# Patient Record
Sex: Male | Born: 1990
Health system: Southern US, Community
[De-identification: ages and names within clinical notes are randomized; demographics above are authoritative.]

## PROBLEM LIST (undated history)

## (undated) DIAGNOSIS — G9349 Other encephalopathy: Secondary | ICD-10-CM

## (undated) DIAGNOSIS — R569 Unspecified convulsions: Secondary | ICD-10-CM

## (undated) DIAGNOSIS — G40909 Epilepsy, unspecified, not intractable, without status epilepticus: Secondary | ICD-10-CM

## (undated) HISTORY — DX: Epilepsy, unspecified, not intractable, without status epilepticus: G40.909

## (undated) HISTORY — DX: Other encephalopathy: G93.49

## (undated) HISTORY — PX: OTHER SURGICAL HISTORY: SHX169

---
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
## 2010-04-21 ENCOUNTER — Inpatient Hospital Stay (HOSPITAL_COMMUNITY)
Admission: EM | Admit: 2010-04-21 | Discharge: 2010-04-23 | Payer: Self-pay | Source: Home / Self Care | Attending: Orthopaedic Surgery | Admitting: Orthopaedic Surgery

## 2010-04-21 LAB — COMPREHENSIVE METABOLIC PANEL
ALT: 43 U/L (ref 0–53)
AST: 32 U/L (ref 0–37)
CO2: 26 mEq/L (ref 19–32)
Chloride: 108 mEq/L (ref 96–112)
GFR calc Af Amer: >60 mL/min (ref >60)
GFR calc non Af Amer: >60 mL/min (ref >60)
Glucose, Bld: 104 mg/dL — ABNORMAL HIGH (ref 70–99)
Sodium: 140 mEq/L (ref 135–145)
Total Bilirubin: 0.6 mg/dL (ref 0.3–1.2)

## 2010-04-21 LAB — CBC
HCT: 39.1 % (ref 39.0–52.0)
Hemoglobin: 13.5 g/dL (ref 13.0–17.0)
MCH: 31.7 pg (ref 26.0–34.0)
MCHC: 34.5 g/dL (ref 30.0–36.0)
RBC: 4.26 MIL/uL (ref 4.22–5.81)

## 2010-04-21 LAB — TYPE AND SCREEN: ABO/RH(D): A POS

## 2010-04-21 LAB — DIFFERENTIAL
Basophils Absolute: 0 10*3/uL (ref 0.0–0.1)
Basophils Relative: 0 % (ref 0–1)
Lymphocytes Relative: 5 % — ABNORMAL LOW (ref 12–46)
Monocytes Absolute: 0.4 10*3/uL (ref 0.1–1.0)
Monocytes Relative: 4 % (ref 3–12)
Neutro Abs: 8.6 10*3/uL — ABNORMAL HIGH (ref 1.7–7.7)
Neutrophils Relative %: 91 % — ABNORMAL HIGH (ref 43–77)

## 2010-04-22 LAB — HEMOGLOBIN AND HEMATOCRIT, BLOOD: HCT: 35 % — ABNORMAL LOW (ref 39.0–52.0)

## 2010-04-23 LAB — HEMOGLOBIN AND HEMATOCRIT, BLOOD
HCT: 33.4 % — ABNORMAL LOW (ref 39.0–52.0)
Hemoglobin: 11.7 g/dL — ABNORMAL LOW (ref 13.0–17.0)

## 2010-04-24 NOTE — Op Note (Addendum)
  NAME:  Kent Peterson, Kent Peterson            ACCOUNT NO.:  [id number]  MEDICAL RECORD NO.:  [id number]          PATIENT TYPE:  INP  LOCATION:  5037                         FACILITY:  [facility]  PHYSICIAN:  Chanler Mendonca C. Ophelia Charter, M.D.    DATE OF BIRTH:  Jul 02, 1990  DATE OF PROCEDURE:  04/21/2010 DATE OF DISCHARGE:                              OPERATIVE REPORT   PREOPERATIVE DIAGNOSIS:  Left intertrochanteric fracture.  POSTOPERATIVE DIAGNOSIS:  Left intertrochanteric fracture.  PROCEDURE:  Left hip compression screw, Synthes 135 degrees side plate, 78-GN lag screw.  SURGEON:  Hamlin Devine C. Ophelia Charter, MD  ANESTHESIA:  GOT plus 12 mL Marcaine and local.  ESTIMATED BLOOD LOSS:  100 mL.  BRIEF HISTORY:  A 20 year old male with caring parents who was placed in in-house swing that he normally gets in each morning and swings for a while as he wakes up.  This has a chair and in between the seat and the waist harness, the middle chain snapped in 2 causing fall out of the chair, flying forward and landing on his left side with bruises to his left elbow and a left intertrochanteric fracture.  The patient has severe autism and CP.  Hand is an ambulator with assistance for balance.  PROCEDURE:  After standard induction of general anesthesia, preoperative Ancef prophylaxis, standard prepping and draping of the left hip, the patient had been placed on the fracture table, well leg holder to the opposite right leg traction, internal rotation.  Minimal traction required due to the alignment and internal rotation lateralizing the trochanter due to some tightness in his hamstrings.  The foot was lowered a little bit more until the femur was lined up parallel with the floor.  Standard prepping, shower curtain, Steri-Drape applied.  Time- out procedure completed.  Incision was made starting at the tip of the trochanter extending in line with the femur, the length of four-hole plate.  Tensor fascia was split in line with the  fibers.  Hemostasis obtained.  Vastus lateralis elevated from posterior to anterior. Initially, based on x-rays, although the patient was in external rotation, it looked like there was some coxa vara.  Guide was placed laterally on the femur and turned out, the 135 degrees guide was appropriate.  Pin was placed center, checked AP and lateral, drilled to 90, 85-mm lag screw placed and the four-hole 135 side plate attached of the femur which fit nicely and kept the fracture in excellent position without displacement, tightened down with 4 screws, 34 mm in length.  Wound was irrigated.  Vastus lateralis reattached posteriorly.  Tensor fascia closed with #1 Vicryl, 2-0 Vicryl in subcutaneous tissue and a 3-0 Vicryl subcuticular skin closure.  Tincture of Benzoin, Steri-Strips and dressing was applied. Instrument count and needle count was correct.     Oval Cavazos C. Ophelia Charter, M.D.     MCY/MEDQ  D:  04/21/2010  T:  04/22/2010  Job:  [id number]  Electronically Signed by Annell Greening M.D. on 04/24/2010 01:04:28 PM

## 2010-06-19 NOTE — Discharge Summary (Signed)
NAME:  Kent Peterson, Kent Peterson            ACCOUNT NO.:  [id number]  MEDICAL RECORD NO.:  [id number]          PATIENT TYPE:  INP  LOCATION:  5037                         FACILITY:  [facility]  PHYSICIAN:  Zamoria Boss C. Ophelia Charter, M.D.    DATE OF BIRTH:  15-Nov-1990  DATE OF ADMISSION:  04/21/2010 DATE OF DISCHARGE:  04/23/2010                              DISCHARGE SUMMARY   ADMISSION DIAGNOSES: 1. Left intertrochanteric hip fracture. 2. Autism. 3. Cerebral palsy.  DISCHARGE DIAGNOSES: 1. Left intertrochanteric hip fracture. 2. Autism. 3. Cerebral palsy.  PROCEDURE:  On April 21, 2010, the patient underwent left hip compression screw with sideplate performed by Dr. Ophelia Charter under general anesthesia.  CONSULTATIONS:  None.  BRIEF HISTORY:  The patient is a 20 year old male with severe autism and cerebral palsy.  He requires assistance for ambulation as well as activities of daily living.  He is a Consulting civil engineer at [employer].  On the morning of admission, he was placed in a chair with a waist harness.  The chain snapped causing the patient to fall out of the chair landing on his left side.  He received bruises to his left elbow.  He was brought to the emergency room where x-rays revealed a left intertrochanteric hip fracture.  It was felt he would require surgical intervention and was admitted for the procedure as stated above.  BRIEF HOSPITAL COURSE:  The patient tolerated the procedure under general anesthesia without complications.  Postoperatively, the patient was given mild analgesics for discomfort and seemed to be comfortable. His left elbow was noted to have a contusion and ecchymosis.  X-ray findings were negative for fracture.  Dressing was placed over the abrasion.  No DVT prophylaxis was utilized chemically, instead SCDs were utilized with early motion in and out of bed.  The patient was seen by the physical therapist and did require maximum assistance for bed to chair transfer.  He did not  actually ambulate during the hospital stay, but was able to pivot for bed to chair transfers.  Dressing change was done, and his wound was healing without signs of infection.  Laboratory values showed hemoglobin dropping from 13.5-11.7 and hematocrit 39.1-33.4.  Otherwise, lab values remained within normal limits.  PLAN:  The patient was discharged to his home with his family.  He was instructed to continue home medications as taken prior to admission and was given prescription for Vicodin 1-2 every 4-6 hours as needed for pain.  Dressing change will be done daily or as needed.  He will keep his wound dry and clean.  The patient is instructed to follow up with Dr. Ophelia Charter in 1 week.  He will continue to receive physical therapy through his [employer]. Weightbearing as tolerated on the left lower extremity for ambulation and for pivoting.  The patient's family was advised to call Dr. Barbaraann Faster office should there be questions or concerns prior to his return office visit.  CONDITION ON DISCHARGE:  Stable.   Wende Neighbors, P.A.   ______________________________ Veverly Fells Ophelia Charter, M.D.    SMV/MEDQ  D:  05/29/2010  T:  05/30/2010  Job:  [id number]  Electronically Signed by Velna Hatchet  VERNON P.A. on 05/31/2010 09:18:42 AM Electronically Signed by Annell Greening M.D. on 06/19/2010 05:58:15 PM

## 2011-01-01 ENCOUNTER — Ambulatory Visit (HOSPITAL_COMMUNITY)
Admission: RE | Admit: 2011-01-01 | Discharge: 2011-01-01 | Disposition: A | Discharge status: Home / Self Care | Payer: 59 | Source: Ambulatory Visit | Attending: Oral Surgery | Admitting: Oral Surgery

## 2011-01-01 DIAGNOSIS — G809 Cerebral palsy, unspecified: Secondary | ICD-10-CM | POA: Insufficient documentation

## 2011-01-01 DIAGNOSIS — F79 Unspecified intellectual disabilities: Secondary | ICD-10-CM | POA: Insufficient documentation

## 2011-01-01 DIAGNOSIS — Z01812 Encounter for preprocedural laboratory examination: Secondary | ICD-10-CM | POA: Insufficient documentation

## 2011-01-01 DIAGNOSIS — H548 Legal blindness, as defined in USA: Secondary | ICD-10-CM | POA: Insufficient documentation

## 2011-01-01 DIAGNOSIS — K006 Disturbances in tooth eruption: Secondary | ICD-10-CM | POA: Insufficient documentation

## 2011-01-01 DIAGNOSIS — K089 Disorder of teeth and supporting structures, unspecified: Secondary | ICD-10-CM | POA: Insufficient documentation

## 2011-01-01 LAB — CBC
Hemoglobin: 14 g/dL (ref 13.0–17.0)
Platelets: 184 10*3/uL (ref 150–400)
RBC: 4.31 MIL/uL (ref 4.22–5.81)
WBC: 2.4 10*3/uL — ABNORMAL LOW (ref 4.0–10.5)

## 2011-01-01 LAB — BASIC METABOLIC PANEL
CO2: 29 mEq/L (ref 19–32)
Calcium: 9.4 mg/dL (ref 8.4–10.5)
Chloride: 102 mEq/L (ref 96–112)
Glucose, Bld: 92 mg/dL (ref 70–99)
Potassium: 3.6 mEq/L (ref 3.5–5.1)
Sodium: 139 mEq/L (ref 135–145)

## 2011-01-03 NOTE — Op Note (Signed)
NAMEBERTHA, Peterson            ACCOUNT NO.:  [id number]  MEDICAL RECORD NO.:  [id number]  LOCATION:  SDSC                         FACILITY:  [facility]  PHYSICIAN:  Georgia Lopes, M.D.  DATE OF BIRTH:  [birth year]  DATE OF PROCEDURE:  01/01/2011 DATE OF DISCHARGE:  01/01/2011                              OPERATIVE REPORT   PREOPERATIVE DIAGNOSIS:  Nonrestorable tooth #22.  POSTOPERATIVE DIAGNOSIS:  Nonrestorable tooth #M, impacted tooth #22.  PROCEDURE:  Removal of teeth numbers M and 22.  SURGEON:  Georgia Lopes, MD  ASSISTANTS: 1. Luberta Mutter, DOMA 2. Jamie Cimler, DOMA.  ANESTHESIA:  General, Dr. Sondra Come attending, oral intubation.  INDICATIONS FOR PROCEDURE:  Kent Peterson is a 20 year old male who is referred by his general dentist for removal of nonrestorable tooth #22.  The patient has significant mental retardation, is legally blind, has history of seizures.  He was unable to be examined in my office due to uncooperativeness.  He was scheduled for removal of reportedly nonrestorable tooth #22 and exam under anesthesia with treatment as necessary.  PROCEDURE:  The patient was taken to the operating room, and ketamine was administered intramuscularly by the anesthesiologist.  Once the patient was adequately anesthetized, he was moved to the operating table and an IV was started and general anesthesia was administered and an endotracheal tube was placed and marked.  The eyes were protected.  The patient was draped for the procedure.  The posterior pharynx was suctioned.  The Yankauer suction was placed and then a throat pack was placed.  The oral cavity was examined.  There appeared to be retained primary tooth #H with palatally erupted tooth #11.  It was determined to leave this as well as there was no existing periodontal disease in this area, and it was doubted that removal of retained tooth number H would in anyway help the eruption of tooth #11 into the correct  tooth position.  In the mandible there was some gingival edema around tooth #22.  There was no crown noted.  The remainder of the oral cavity was healthy and without obvious lesions or decay.  The decision was made to only remove tooth #22.  Lidocaine 2%, 1:100,000 epinephrine was infiltrated in the inferior alveolar block on the left side and infiltration around tooth #22.  A total of 5 mL was utilized.  A bite block was placed in the right side of the mouth and a sweetheart retractor was used to retract the tongue.  A #15 blade was used to make a full-thickness incision beginning at tooth #21 and carrying anteriorly to tooth #23.  The periosteum was reflected.  There was no obvious tooth #22 present.  There was some granulomatous material in this area and portions of root which appeared to be primary tooth.  These were curetted and then the socket was examined and there appeared to be a portion of the tooth impacted down below the bony crypt from the infection.  Bone was removed and impacted tooth #22 was identified.  The tooth was uncovered by removing bone with the handpiece under irrigation.  The tooth was sectioned in multiple pieces and removed and the socket was curetted, irrigated, and closed  with 3-0 chromic.  The patient tolerated the procedure well.  The oral cavity was irrigated, suctioned, and throat pack was removed.  The patient was taken to the recovery room, breathing spontaneously in good condition.  ESTIMATED BLOOD LOSS:  Minimum.  COMPLICATIONS:  None.  SPECIMENS:  None.     Georgia Lopes, M.D.     SMJ/MEDQ  D:  01/01/2011  T:  01/01/2011  Job:  [id number]  Electronically Signed by Ocie Doyne M.D. on 01/03/2011 03:17:36 PM

## 2015-01-21 ENCOUNTER — Emergency Department (INDEPENDENT_AMBULATORY_CARE_PROVIDER_SITE_OTHER)
Admission: EM | Admit: 2015-01-21 | Discharge: 2015-01-21 | Disposition: A | Discharge status: Home / Self Care | Payer: Medicaid Other | Source: Home / Self Care | Attending: Family Medicine | Admitting: Family Medicine

## 2015-01-21 DIAGNOSIS — L089 Local infection of the skin and subcutaneous tissue, unspecified: Secondary | ICD-10-CM

## 2015-01-21 DIAGNOSIS — L0889 Other specified local infections of the skin and subcutaneous tissue: Secondary | ICD-10-CM

## 2015-01-21 DIAGNOSIS — L02411 Cutaneous abscess of right axilla: Secondary | ICD-10-CM

## 2015-01-21 MED ORDER — CLINDAMYCIN HCL 300 MG PO CAPS: 300.0000 mg | ORAL_CAPSULE | Freq: Three times a day (TID) | ORAL | Status: DC

## 2015-01-21 NOTE — ED Provider Notes (Signed)
CSN: [id number]     Arrival date & time 01/21/15  1405 History   First MD Initiated Contact with Patient 01/21/15 1600     No chief complaint on file.  (Consider location/radiation/quality/duration/timing/severity/associated sxs/prior Treatment) HPI Comments: 24 year old male with cerebral palsy and autism is brought in by the parents with a complaint of boils under the arms and one located to the right lateral chest. These were initially discovered yesterday. The patient is unable to give an account her review of systems as he is nonverbal. He does not have a history of having abscesses or boils.   No past medical history on file. No past surgical history on file. No family history on file. Social History  Substance Use Topics  . Smoking status: Not on file  . Smokeless tobacco: Not on file  . Alcohol Use: Not on file    Review of Systems  Reason unable to perform ROS: Nonverbal male with autism and cerebral palsy.  Constitutional: Negative.     Allergies  Review of patient's allergies indicates not on file.  Home Medications   Prior to Admission medications   Not on File   Meds Ordered and Administered this Visit  Medications - No data to display  BP 125/92 mmHg  Pulse 111  Temp(Src)   Resp 20  SpO2 98% No data found.   Physical Exam  Constitutional: He appears well-nourished. No distress.  Neck: Neck supple.  Cardiovascular:  Tachycardic rate.  Pulmonary/Chest: Effort normal. No respiratory distress.  Neurological: He is alert.  Skin: Skin is warm.  The left axilla with a 3 mm slightly raised red lesion that appears to be resolving.  there is no pain reaction with palpation, no surrounding erythema, no drainage, lymphangitis, cellulitis.   Right axilla with 2 visible lesions, one appears to have ruptured in the past hour. It measures approximately 1 cm across. There is a smaller more superficial red tender lesion that is not draining. There is no surrounding  cellulitis or lymphangitis.  There is a 2 cm diameter red slightly raised tender lesion to the right anterior chest approximately 4 cm right of the nipple. Palpation does produce a pain response from the patient. Does not palpate as fluctuant. It is currently not draining.   Nursing note and vitals reviewed.   ED Course  Procedures (including critical care time)  Labs Review Labs Reviewed - No data to display  Imaging Review No results found.   Visual Acuity Review  Right Eye Distance:   Left Eye Distance:   Bilateral Distance:    Right Eye Near:   Left Eye Near:    Bilateral Near:         MDM   1. Skin pustule   2. Abscess of axilla, right     Warm compresses as discussed. Clindamycin as directed. If the lesions are not resolving and in particular if they are becoming larger and developed surrounding redness seek medical attention promptly. May see PCP or go to the ED. If incision and drainage is required he will have to be sedated and that can only be done in the emergency department.    Kent Rasmussenavid Marcianne Ozbun, NP 01/21/15 [phone]

## 2015-01-21 NOTE — Discharge Instructions (Signed)
Abscess Warm compresses as discussed. Clindamycin as directed. If the lesions are not resolving and in particular if they are becoming larger and developed surrounding redness seek medical attention promptly. May see PCP or go to the ED. If incision and drainage is required he will have to be sedated and that can only be done in the emergency department. An abscess is an infected area that contains a collection of pus and debris.It can occur in almost any part of the body. An abscess is also known as a furuncle or boil. CAUSES  An abscess occurs when tissue gets infected. This can occur from blockage of oil or sweat glands, infection of hair follicles, or a minor injury to the skin. As the body tries to fight the infection, pus collects in the area and creates pressure under the skin. This pressure causes pain. People with weakened immune systems have difficulty fighting infections and get certain abscesses more often.  SYMPTOMS Usually an abscess develops on the skin and becomes a painful mass that is red, warm, and tender. If the abscess forms under the skin, you may feel a moveable soft area under the skin. Some abscesses break open (rupture) on their own, but most will continue to get worse without care. The infection can spread deeper into the body and eventually into the bloodstream, causing you to feel ill.  DIAGNOSIS  Your caregiver will take your medical history and perform a physical exam. A sample of fluid may also be taken from the abscess to determine what is causing your infection. TREATMENT  Your caregiver may prescribe antibiotic medicines to fight the infection. However, taking antibiotics alone usually does not cure an abscess. Your caregiver may need to make a small cut (incision) in the abscess to drain the pus. In some cases, gauze is packed into the abscess to reduce pain and to continue draining the area. HOME CARE INSTRUCTIONS   Only take over-the-counter or prescription  medicines for pain, discomfort, or fever as directed by your caregiver.  If you were prescribed antibiotics, take them as directed. Finish them even if you start to feel better.  If gauze is used, follow your caregiver's directions for changing the gauze.  To avoid spreading the infection:  Keep your draining abscess covered with a bandage.  Wash your hands well.  Do not share personal care items, towels, or whirlpools with others.  Avoid skin contact with others.  Keep your skin and clothes clean around the abscess.  Keep all follow-up appointments as directed by your caregiver. SEEK MEDICAL CARE IF:   You have increased pain, swelling, redness, fluid drainage, or bleeding.  You have muscle aches, chills, or a general ill feeling.  You have a fever. MAKE SURE YOU:   Understand these instructions.  Will watch your condition.  Will get help right away if you are not doing well or get worse.   This information is not intended to replace advice given to you by your health care provider. Make sure you discuss any questions you have with your health care provider.   Document Released: 12/19/2004 Document Revised: 09/10/2011 Document Reviewed: 05/24/2011 Elsevier Interactive Patient Education [employer]2016 Elsevier Inc.

## 2015-02-25 ENCOUNTER — Emergency Department (HOSPITAL_COMMUNITY): Payer: Medicaid Other

## 2015-02-25 ENCOUNTER — Encounter (HOSPITAL_COMMUNITY): Payer: Self-pay | Admitting: Emergency Medicine

## 2015-02-25 ENCOUNTER — Emergency Department (HOSPITAL_COMMUNITY)
Admission: EM | Admit: 2015-02-25 | Discharge: 2015-02-25 | Disposition: A | Discharge status: Home / Self Care | Payer: Medicaid Other | Attending: Emergency Medicine | Admitting: Emergency Medicine

## 2015-02-25 DIAGNOSIS — Z91040 Latex allergy status: Secondary | ICD-10-CM | POA: Diagnosis not present

## 2015-02-25 DIAGNOSIS — Y92091 Bathroom in other non-institutional residence as the place of occurrence of the external cause: Secondary | ICD-10-CM | POA: Insufficient documentation

## 2015-02-25 DIAGNOSIS — Y998 Other external cause status: Secondary | ICD-10-CM | POA: Insufficient documentation

## 2015-02-25 DIAGNOSIS — W1839XA Other fall on same level, initial encounter: Secondary | ICD-10-CM | POA: Diagnosis not present

## 2015-02-25 DIAGNOSIS — Y9389 Activity, other specified: Secondary | ICD-10-CM | POA: Insufficient documentation

## 2015-02-25 DIAGNOSIS — Z792 Long term (current) use of antibiotics: Secondary | ICD-10-CM | POA: Diagnosis not present

## 2015-02-25 DIAGNOSIS — S79911A Unspecified injury of right hip, initial encounter: Secondary | ICD-10-CM | POA: Insufficient documentation

## 2015-02-25 DIAGNOSIS — M25551 Pain in right hip: Secondary | ICD-10-CM

## 2015-02-25 HISTORY — DX: Unspecified convulsions: R56.9

## 2015-02-25 MED ORDER — ACETAMINOPHEN 160 MG/5ML PO SOLN
650.0000 mg | Freq: Once | ORAL | Status: AC
  Administered 2015-02-25: 650 mg via ORAL
  Filled 2015-02-25: qty 20.3

## 2015-02-25 MED ORDER — IBUPROFEN 100 MG/5ML PO SUSP
800.0000 mg | Freq: Once | ORAL | Status: AC
  Administered 2015-02-25: 800 mg via ORAL
  Filled 2015-02-25 (×2): qty 40

## 2015-02-25 NOTE — ED Notes (Signed)
Pt from home with parents c/o fall today while being assisted to bathroom; pt is non verbal; pt will not stand per family and appears to have pain in right hip area

## 2015-02-25 NOTE — ED Notes (Signed)
MD in with patient and assessed.

## 2015-02-25 NOTE — Discharge Instructions (Signed)
Take up to 800mg  of ibuprofen 3 times a day.  Follow up in two days if not improved for rexray  Hip Pain Your hip is the joint between your upper legs and your lower pelvis. The bones, cartilage, tendons, and muscles of your hip joint perform a lot of work each day supporting your body weight and allowing you to move around. Hip pain can range from a minor ache to severe pain in one or both of your hips. Pain may be felt on the inside of the hip joint near the groin, or the outside near the buttocks and upper thigh. You may have swelling or stiffness as well.  HOME CARE INSTRUCTIONS   Take medicines only as directed by your health care provider.  Apply ice to the injured area:  Put ice in a plastic bag.  Place a towel between your skin and the bag.  Leave the ice on for 15-20 minutes at a time, 3-4 times a day.  Keep your leg raised (elevated) when possible to lessen swelling.  Avoid activities that cause pain.  Follow specific exercises as directed by your health care provider.  Sleep with a pillow between your legs on your most comfortable side.  Record how often you have hip pain, the location of the pain, and what it feels like. SEEK MEDICAL CARE IF:   You are unable to put weight on your leg.  Your hip is red or swollen or very tender to touch.  Your pain or swelling continues or worsens after 1 week.  You have increasing difficulty walking.  You have a fever. SEEK IMMEDIATE MEDICAL CARE IF:   You have fallen.  You have a sudden increase in pain and swelling in your hip. MAKE SURE YOU:   Understand these instructions.  Will watch your condition.  Will get help right away if you are not doing well or get worse.   This information is not intended to replace advice given to you by your health care provider. Make sure you discuss any questions you have with your health care provider.   Document Released: 08/29/2009 Document Revised: 04/01/2014 Document Reviewed:  11/05/2012 Elsevier Interactive Patient Education [employer]vier [employer].

## 2015-02-25 NOTE — ED Provider Notes (Signed)
CSN: [id number]     Arrival date & time 02/25/15  1459 History   First MD Initiated Contact with Patient 02/25/15 1634     Chief Complaint  Patient presents with  . Fall     (Consider location/radiation/quality/duration/timing/severity/associated sxs/prior Treatment) Patient is a 24 y.o. male presenting with fall. The history is provided by the patient.  Fall This is a new problem. The current episode started less than 1 hour ago. The problem occurs constantly. The problem has not changed since onset.Pertinent negatives include no chest pain, no abdominal pain, no headaches and no shortness of breath. The symptoms are aggravated by walking and twisting. Nothing relieves the symptoms. He has tried nothing for the symptoms. The treatment provided no relief.   Level V caveat patient is nonverbal. Per his family he fell on the bathroom today. They noticed him on his right side. Refusing to walk since the fall. Patient will not put any weight on his right leg. They feel like he is having pain with movement of that right side. Denies any other injury that they've seen. There is some bruising to the right lateral aspect of the hip.  Past Medical History  Diagnosis Date  . Seizures (HCC)    History reviewed. No pertinent past surgical history. History reviewed. No pertinent family history. Social History  Substance Use Topics  . Smoking status: Never Smoker   . Smokeless tobacco: None  . Alcohol Use: No    Review of Systems  Unable to perform ROS: Patient nonverbal  Constitutional: Negative for fever and chills.  HENT: Negative for congestion and facial swelling.   Eyes: Negative for discharge and visual disturbance.  Respiratory: Negative for shortness of breath.   Cardiovascular: Negative for chest pain and palpitations.  Gastrointestinal: Negative for vomiting, abdominal pain and diarrhea.  Musculoskeletal: Positive for myalgias and arthralgias.  Skin: Negative for color change and  rash.  Neurological: Negative for tremors, syncope and headaches.  Psychiatric/Behavioral: Negative for confusion and dysphoric mood.      Allergies  Latex  Home Medications   Prior to Admission medications   Medication Sig Start Date End Date Taking? Authorizing Provider  clindamycin (CLEOCIN) 300 MG capsule Take 1 capsule (300 mg total) by mouth 3 (three) times daily. 01/21/15   Hayden Rasmussenavid Mabe, NP   BP 116/76 mmHg  Pulse 85  Temp(Src) 98.4 F (36.9 C) (Oral)  Resp 18  SpO2 100% Physical Exam  Constitutional: He is oriented to person, place, and time. He appears well-developed and well-nourished.  HENT:  Head: Normocephalic and atraumatic.  Eyes: EOM are normal. Pupils are equal, round, and reactive to light.  Neck: Normal range of motion. Neck supple. No JVD present.  Cardiovascular: Normal rate and regular rhythm.  Exam reveals no gallop and no friction rub.   No murmur heard. Pulmonary/Chest: No respiratory distress. He has no wheezes.  Abdominal: He exhibits no distension. There is no tenderness. There is no rebound and no guarding.  Musculoskeletal: Normal range of motion. He exhibits tenderness (Terence palpation about the right greater trochanter. Pain with internal and external rotation).  Neurological: He is alert and oriented to person, place, and time.  Skin: No rash noted. No pallor.  Psychiatric: He has a normal mood and affect. His behavior is normal.  Nursing note and vitals reviewed.   ED Course  Procedures (including critical care time) Labs Review Labs Reviewed - No data to display  Imaging Review Dg Hip Unilat  With Pelvis 2-3 Views  Right  02/25/2015  CLINICAL DATA:  Right hip pain after falling today. History of seizures. Nonverbal. Initial encounter. EXAM: DG HIP (WITH OR WITHOUT PELVIS) 2-3V RIGHT COMPARISON:  Pelvic and left hip radiographs 04/21/2010. FINDINGS: No evidence of acute fracture or dislocation. Patient is status post proximal left femoral  ORIF, incompletely visualized. The right femoral head and neck appear somewhat dysplastic, but unchanged. No significant hip joint space loss. IMPRESSION: No acute osseous findings. Electronically Signed   By: Carey Bullocks M.D.   On: 02/25/2015 16:48   I have personally reviewed and evaluated these images and lab results as part of my medical decision-making.   EKG Interpretation None      MDM   Final diagnoses:  Right hip pain    24 yo M with a chief complaint of right hip pain. X-ray negative for acute fracture or dislocation. Difficulty to examine patient secondary to cerebral palsy. Discussed findings with family. Suggested they tried Tylenol and Motrin at home return for continued pain after couple days for re-x-ray.  11:36 PM:  I have discussed the diagnosis/risks/treatment options with the family and believe the pt to be eligible for discharge home to follow-up with PCP. We also discussed returning to the ED immediately if new or worsening sx occur. We discussed the sx which are most concerning (e.g., sudden worsening pain, fever, continued pain) that necessitate immediate return. Medications administered to the patient during their visit and any new prescriptions provided to the patient are listed below.  Medications given during this visit Medications  acetaminophen (TYLENOL) solution 650 mg (650 mg Oral Given 02/25/15 1713)  ibuprofen (ADVIL,MOTRIN) 100 MG/5ML suspension 800 mg (800 mg Oral Given 02/25/15 1713)    Discharge Medication List as of 02/25/2015  4:56 PM      The patient appears reasonably screen and/or stabilized for discharge and I doubt any other medical condition or other [place] requiring further screening, evaluation, or treatment in the ED at this time prior to discharge.      Melene Plan, DO 02/25/15 2336

## 2015-03-09 ENCOUNTER — Ambulatory Visit
Admission: RE | Admit: 2015-03-09 | Discharge: 2015-03-09 | Disposition: A | Discharge status: Home / Self Care | Payer: 59 | Source: Ambulatory Visit | Attending: Sports Medicine | Admitting: Sports Medicine

## 2015-03-09 ENCOUNTER — Other Ambulatory Visit: Payer: Self-pay | Admitting: Sports Medicine

## 2015-03-09 DIAGNOSIS — M25551 Pain in right hip: Secondary | ICD-10-CM

## 2015-03-10 ENCOUNTER — Other Ambulatory Visit: Payer: Self-pay | Admitting: Sports Medicine

## 2015-03-10 DIAGNOSIS — M25551 Pain in right hip: Secondary | ICD-10-CM

## 2015-11-13 ENCOUNTER — Emergency Department (HOSPITAL_COMMUNITY)
Admission: EM | Admit: 2015-11-13 | Discharge: 2015-11-13 | Disposition: A | Discharge status: Home / Self Care | Payer: Medicaid Other | Attending: Emergency Medicine | Admitting: Emergency Medicine

## 2015-11-13 ENCOUNTER — Encounter (HOSPITAL_COMMUNITY): Payer: Self-pay | Admitting: Nurse Practitioner

## 2015-11-13 ENCOUNTER — Emergency Department (HOSPITAL_COMMUNITY): Payer: Medicaid Other

## 2015-11-13 DIAGNOSIS — Z91040 Latex allergy status: Secondary | ICD-10-CM | POA: Diagnosis not present

## 2015-11-13 DIAGNOSIS — R34 Anuria and oliguria: Secondary | ICD-10-CM | POA: Diagnosis not present

## 2015-11-13 DIAGNOSIS — Z79899 Other long term (current) drug therapy: Secondary | ICD-10-CM | POA: Insufficient documentation

## 2015-11-13 DIAGNOSIS — R339 Retention of urine, unspecified: Secondary | ICD-10-CM | POA: Diagnosis present

## 2015-11-13 LAB — CBC
HCT: 42.1 % (ref 39.0–52.0)
HEMOGLOBIN: 14.4 g/dL (ref 13.0–17.0)
MCH: 31.8 pg (ref 26.0–34.0)
MCHC: 34.2 g/dL (ref 30.0–36.0)
MCV: 92.9 fL (ref 78.0–100.0)
Platelets: 174 10*3/uL (ref 150–400)
RBC: 4.53 MIL/uL (ref 4.22–5.81)
RDW: 12.2 % (ref 11.5–15.5)
WBC: 2.8 10*3/uL — AB (ref 4.0–10.5)

## 2015-11-13 LAB — BASIC METABOLIC PANEL
ANION GAP: 8 (ref 5–15)
BUN: 16 mg/dL (ref 6–20)
CALCIUM: 9.1 mg/dL (ref 8.9–10.3)
CO2: 26 mmol/L (ref 22–32)
Chloride: 104 mmol/L (ref 101–111)
Creatinine, Ser: 0.71 mg/dL (ref 0.61–1.24)
Glucose, Bld: 101 mg/dL — ABNORMAL HIGH (ref 65–99)
Potassium: 3.7 mmol/L (ref 3.5–5.1)
SODIUM: 138 mmol/L (ref 135–145)

## 2015-11-13 LAB — URINALYSIS, ROUTINE W REFLEX MICROSCOPIC
Glucose, UA: NEGATIVE mg/dL
Hgb urine dipstick: NEGATIVE
KETONES UR: NEGATIVE mg/dL
Leukocytes, UA: NEGATIVE
Nitrite: NEGATIVE
PROTEIN: NEGATIVE mg/dL
Specific Gravity, Urine: 1.033 — ABNORMAL HIGH (ref 1.005–1.030)
pH: 5.5 (ref 5.0–8.0)

## 2015-11-13 NOTE — ED Provider Notes (Signed)
[facility]-EMERGENCY DEPT Provider Note   CSN: [id number] Arrival date & time: 11/13/15  1210     History   Chief Complaint Chief Complaint  Patient presents with  . Urinary Retention  Level V caveat due to nonverbal status.  HPI Kent Peterson is a 25 y.o. male.  The history is provided by the patient and a parent.  Patient was brought in by his mother for decreased urination. Past medical history is reported to seizures but apparently has some sort of syndrome or other medical issues with it. Baseline nonverbal and nonambulatory. Reportedly has only urinated once today. Mother is worried about this. He also is been more agitated. No fevers or chills. Good oral intake.  Past Medical History:  Diagnosis Date  . Seizures (HCC)     There are no active problems to display for this patient.   History reviewed. No pertinent surgical history.     Home Medications    Prior to Admission medications   Medication Sig Start Date End Date Taking? Authorizing Provider  clindamycin (CLEOCIN) 300 MG capsule Take 1 capsule (300 mg total) by mouth 3 (three) times daily. 01/21/15   Hayden Rasmussenavid Mabe, NP    Family History History reviewed. No pertinent family history.  Social History Social History  Substance Use Topics  . Smoking status: Never Smoker  . Smokeless tobacco: Never Used  . Alcohol use No     Allergies   Latex   Review of Systems Review of Systems  Unable to perform ROS: Patient nonverbal     Physical Exam Updated Vital Signs BP 131/78 (BP Location: Right Arm)   Pulse 85   Temp 98 F (36.7 C) (Axillary)   Resp 16   Ht 5\' 6"  (1.676 m)   Wt 102 lb (46.3 kg)   SpO2 98%   BMI 16.46 kg/m   Physical Exam  HENT:  Head: Atraumatic.  Eyes: Pupils are equal, round, and reactive to light.  Neck: Neck supple.  Cardiovascular: Normal rate.   Pulmonary/Chest: Effort normal.  Abdominal: There is no tenderness.  Genitourinary: No penile tenderness.    Musculoskeletal: He exhibits no edema.  Neurological:  Patient is somewhat contracted at baseline. Evidence of chewing injury on his forearms. Patient will bite on his arms when he is stimulated.  Skin: Skin is warm.     ED Treatments / Results  Labs (all labs ordered are listed, but only abnormal results are displayed) Labs Reviewed  URINALYSIS, ROUTINE W REFLEX MICROSCOPIC (NOT AT [facility]) - Abnormal; Notable for the following:       Result Value   Color, Urine AMBER (*)    Specific Gravity, Urine 1.033 (*)    Bilirubin Urine SMALL (*)    All other components within normal limits  BASIC METABOLIC PANEL - Abnormal; Notable for the following:    Glucose, Bld 101 (*)    All other components within normal limits  CBC - Abnormal; Notable for the following:    WBC 2.8 (*)    All other components within normal limits    EKG  EKG Interpretation None       Radiology No results found.  Procedures Procedures (including critical care time)  Medications Ordered in ED Medications - No data to display   Initial Impression / Assessment and Plan / ED Course  I have reviewed the triage vital signs and the nursing notes.  Pertinent labs & imaging results that were available during my care of the patient were reviewed  by me and considered in my medical decision making (see chart for details).  Clinical Course   Patient with possible decreased urination. Bladder scan showed 116 mL's. Good renal function. Urine did not show infection. Will discharge home.  Final Clinical Impressions(s) / ED Diagnoses   Final diagnoses:  None    New Prescriptions New Prescriptions   No medications on file     Benjiman CoreNathan Cadynce Garrette, MD 11/13/15 1458

## 2015-11-13 NOTE — ED Triage Notes (Signed)
Mother states the patient has only urinated once yesterday and today. She reports he is more agitated than normal. He has not had any fevers or vomiting. He does not have  a history of UTI. Pt is developmentally delayed, nonverbal.

## 2016-06-19 DIAGNOSIS — R569 Unspecified convulsions: Secondary | ICD-10-CM | POA: Diagnosis not present

## 2016-06-19 DIAGNOSIS — B941 Sequelae of viral encephalitis: Secondary | ICD-10-CM | POA: Diagnosis not present

## 2016-08-02 DIAGNOSIS — Z5181 Encounter for therapeutic drug level monitoring: Secondary | ICD-10-CM | POA: Diagnosis not present

## 2016-08-02 DIAGNOSIS — G9340 Encephalopathy, unspecified: Secondary | ICD-10-CM | POA: Diagnosis not present

## 2016-12-10 DIAGNOSIS — Z23 Encounter for immunization: Secondary | ICD-10-CM | POA: Diagnosis not present

## 2016-12-10 DIAGNOSIS — R269 Unspecified abnormalities of gait and mobility: Secondary | ICD-10-CM | POA: Diagnosis not present

## 2016-12-10 DIAGNOSIS — R569 Unspecified convulsions: Secondary | ICD-10-CM | POA: Diagnosis not present

## 2016-12-10 DIAGNOSIS — Z7409 Other reduced mobility: Secondary | ICD-10-CM | POA: Diagnosis not present

## 2017-01-22 ENCOUNTER — Ambulatory Visit: Payer: 59 | Attending: Registered Nurse | Admitting: Physical Therapy

## 2017-01-22 DIAGNOSIS — R2689 Other abnormalities of gait and mobility: Secondary | ICD-10-CM | POA: Diagnosis not present

## 2017-01-22 DIAGNOSIS — M6281 Muscle weakness (generalized): Secondary | ICD-10-CM | POA: Insufficient documentation

## 2017-01-22 NOTE — Therapy (Signed)
[facility] [address] [place], [place], [address] Phone: [phone]   Fax:  [phone]  Physical Therapy Evaluation  Patient Details  Name: Kent Peterson MRN: [id number] Date of Birth: 07-Feb-1991 Referring Provider: Toy Baker, NP  Encounter Date: 01/22/2017      PT End of Session - 01/22/17 1348    Visit Number 1   PT Start Time 1050   PT Stop Time 1201   PT Time Calculation (min) 71 min      Past Medical History:  Diagnosis Date  . Seizures ([place])     No past surgical history on file.  There were no vitals filed for this visit.       Subjective Assessment - 01/22/17 1346    Subjective Pt seen for manual wheelchair eval with Deberah Pelton, ATP   Currently in Pain? No/denies            [facility] PT Assessment - 01/22/17 0001      Assessment   Medical Diagnosis CP   Referring Provider Toy Baker, NP   Onset Date/Surgical Date --  Congenital     Precautions   Precautions Fall     Balance Screen   Has the patient fallen in the past 6 months No   Has the patient had a decrease in activity level because of a fear of falling?  No   Is the patient reluctant to leave their home because of a fear of falling?  No              Mobility/Seating Evaluation    PATIENT INFORMATION: Name: Kent Peterson DOB: 1990/05/06  Sex: M Date seen: 01-22-17 Time: 1100  Address:  [address]                 [place], [place] [address] Physician: Toy Baker, NP This evaluation/justification form will serve as the LMN for the following suppliers: __________________________ Supplier: [employer] Contact Person: Deberah Pelton, Wess Botts Phone:  [phone]   Seating Therapist: Guido Sander, PT Phone:   [phone]   Phone: [phone]    Spouse/Parent/Caregiver name: Merton Wadlow  Phone number: [phone] Insurance/Payer: UHC/Medicaid      Reason for Referral:  manual wheelchair evaluation  Patient/Caregiver Goals: obtain new manual wheelchair  Patient was seen for face-to-face evaluation for new manual wheelchair.  Also present was Deberah Pelton, ATP to discuss recommendations and wheelchair options.  Further paperwork was completed and sent to vendor.  Patient appears to qualify for manual mobility device at this time per objective findings.   MEDICAL HISTORY: Diagnosis: Primary Diagnosis: Cerebral Palsy Onset: Congenital Diagnosis: Generalized idiopathic epilepsy with seizures   '[]'$ Progressive Disease Relevant past and future surgeries: bil. hamstring tendon release and heel cord lengthening   Height: 5'5" Weight: 105# Explain recent changes or trends in weight: ?????   History including Falls: Mother reports pt has no had any falls within past     HOME ENVIRONMENT: '[x]'$ House  '[]'$ Condo/town home  '[]'$ Apartment  '[]'$ Assisted Living    '[]'$ Lives Alone '[x]'$  Lives with Others  Hours with caregiver: 24  '[x]'$ Home is accessible to patient           Stairs      '[x]'$ Yes '[]'$  No     Ramp '[x]'$ Yes '[]'$ No Comments:  ?????   COMMUNITY ADL: TRANSPORTATION: '[x]'$ Car    '[]'$ Van    '[]'$ Public Transportation    '[]'$ Adapted w/c Lift    '[]'$ Ambulance    '[]'$ Other:       '[]'$ Sits in wheelchair during transport  Employment/School: Pt attends [employer] 4 days/week 8:30 - 2:00 Specific requirements pertaining to mobility ?????  Other: wheelchair lift is on back of vehicle    FUNCTIONAL/SENSORY PROCESSING SKILLS:  Handedness:   '[x]'$ Right     '[]'$ Left    '[]'$ NA  Comments:  pt is dependent for all wheelchair propulsion  Functional Processing Skills for Wheeled Mobility '[]'$ Processing Skills are adequate for safe wheelchair operation  Areas of concern than may interfere with safe operation of wheelchair Description of problem   '[]'$  Attention to environment      '[]'$ Judgment      '[]'$  Hearing  '[x]'$  Vision or  visual processing      '[]'$ Motor Planning  '[]'$  Fluctuations in Behavior  pt is legally blind    VERBAL COMMUNICATION: '[]'$ WFL receptive '[]'$  WFL expressive '[]'$ Understandable  '[]'$ Difficult to understand  '[x]'$ non-communicative '[]'$  Uses an augmented communication device  CURRENT SEATING / MOBILITY: Current Mobility Base:  '[]'$ None '[]'$ Dependent '[x]'$ Manual '[]'$ Scooter '[]'$ Power  Type of Control: ?????  Manufacturer:  [employer]:  15" x 20"Age: 84+  Current Condition of Mobility Base:  in disrepair   Current Wheelchair components:  ?????  Describe posture in present seating system:  ?????      SENSATION and SKIN ISSUES: Sensation '[x]'$ Intact  '[]'$ Impaired '[]'$ Absent  Level of sensation: ????? Pressure Relief: Able to perform effective pressure relief :    '[]'$ Yes  '[x]'$  No Method: ???? If not, Why?: ?????  Skin Issues/Skin Integrity Current Skin Issues  '[]'$ Yes '[x]'$ No '[]'$ Intact '[]'$  Red area'[]'$  Open Area  '[]'$ Scar Tissue '[x]'$ At risk from prolonged sitting Where  ?????  History of Skin Issues  '[x]'$ Yes '[]'$ No Where  over coccyx area When  approx. 3 months ago  Hx of skin flap surgeries  '[]'$ Yes '[x]'$ No Where  ????? When  ?????  Limited sitting tolerance '[]'$ Yes '[x]'$ No Hours spent sitting in wheelchair daily: 4-6 hours/day  Complaint of Pain:  Please describe: none   Swelling/Edema: none   ADL STATUS (in reference to wheelchair use):  Indep Assist Unable Indep with Equip Not assessed Comments  Dressing ????? ????? X ????? ????? ?????  Eating ????? ????? X ????? ????? ?????  Toileting ????? ????? X ????? ????? ?????  Bathing ????? ????? X ????? ????? ?????  Grooming/Hygiene ????? ????? X ????? ????? ?????  Meal Prep ????? ????? X ????? ????? ?????  IADLS ????? ????? X ????? ????? uses manual wheelchair with caregiver propelling wheelchair  Bowel Management: '[]'$ Continent  '[x]'$ Incontinent  '[]'$ Accidents Comments:  ?????  Bladder Management: '[]'$ Continent  '[x]'$ Incontinent  '[]'$ Accidents Comments:  ?????     WHEELCHAIR  SKILLS: Manual w/c Propulsion: '[]'$ UE or LE strength and endurance sufficient to participate in ADLs using manual wheelchair Arm : '[]'$ left '[]'$ right   '[]'$ Both      Distance: ????? Foot:  '[]'$ left '[]'$ right   '[]'$ Both  Operate Scooter: '[]'$  Strength, hand grip, balance and transfer appropriate for use '[]'$ Living environment is accessible for use of scooter  Operate Power w/c:  '[]'$  Std. Joystick   '[]'$  Alternative Controls Indep '[]'$  Assist '[]'$   Dependent/unable '[]'$  N/A '[]'$   '[]'$ Safe          '[]'$  Functional      Distance: ?????  Bed confined without wheelchair '[x]'$  Yes '[]'$  No   STRENGTH/RANGE OF MOTION:  Passive Range of Motion Strength  Shoulder Rt shoulder flexion approx. 100 degrees; abdct= 90 degrees unable to accurately assess due to cognitive deficts - pt able to lift RUE and LUE to 90 degrees  Elbow WFL's bil. UE's for flexion and extension at least 3/5 based on AROM - unable to perform MMT due to cognitive deficits  Wrist/Hand WFL's for bil. finger flexion and extension at least 3/5 - pt able to functionally hold small object  Hip WFL's for passive flexion & extension at least 3/5  Knee Rt knee extension -23 degrees:  Lt knee extension -28 degrees at least 3/5  Ankle WFL's bil. LE's at least 2 - 2+/5     MOBILITY/BALANCE:  '[]'$  Patient is totally dependent for mobility  ?????    Balance Transfers Ambulation  Sitting Balance: Standing Balance: '[]'$  Independent '[]'$  Independent/Modified Independent  '[x]'$  WFL     '[]'$  WFL '[]'$  Supervision '[]'$  Supervision  '[]'$  Uses UE for balance  '[]'$  Supervision '[]'$  Min Assist '[x]'$  Ambulates with Assist  approx. 10' - with bil. UE support from caregiver - caregiver providing support due to balance deficits    '[]'$  Min Assist '[]'$  Min assist '[]'$  Mod Assist '[]'$  Ambulates with Device:      '[]'$  RW  '[]'$  StW  '[]'$  Cane  '[]'$  ?????  '[]'$  Mod Assist '[x]'$  Mod assist '[]'$  Max assist   '[]'$  Max Assist '[]'$  Max assist '[x]'$  Dependent '[]'$  Indep. Short Distance Only  '[]'$  Unable '[]'$  Unable '[]'$  Lift / Sling Required Distance (in  feet)  ?????   '[]'$  Sliding board '[]'$  Unable to Ambulate (see explanation below)  Cardio Status:  '[x]'$ Intact  '[]'$  Impaired   '[]'$  NA     ?????  Respiratory Status:  '[x]'$ Intact   '[]'$ Impaired   '[]'$ NA     ?????  Orthotics/Prosthetics: None  Comments (Address manual vs power w/c vs scooter): Pt is dependent for all mobility and he is legally blind; pt requires tilt-in-space manual wheelchair so that caregiver can perform pressure relief to prevent skin breakdown, which he has previously had in the past per mother's report             Anterior / Posterior Obliquity Rotation-Pelvis Decreased lumbar lordosis  PELVIS    '[]'$  '[x]'$  '[]'$   Neutral Posterior Anterior  '[]'$  '[]'$  '[x]'$   WFL Rt elev Lt elev  '[x]'$  '[]'$  '[x]'$   WFL Right Left                      Anterior    Anterior     '[]'$  Fixed '[]'$  Other '[]'$  Partly Flexible '[x]'$  Flexible   '[]'$  Fixed '[]'$  Other '[x]'$  Partly Flexible  '[]'$  Flexible  '[]'$  Fixed '[]'$  Other '[x]'$  Partly Flexible  '[]'$  Flexible   TRUNK  '[]'$  '[]'$  '[]'$   WFL ? Thoracic ? Lumbar  Kyphosis Lordosis  '[]'$  '[]'$  '[x]'$   WFL Convex Convex  Right Left '[x]'$ c-curve '[]'$ s-curve '[]'$ multiple  '[x]'$  Neutral '[]'$  Left-anterior '[]'$  Right-anterior     '[]'$  Fixed '[]'$  Flexible '[]'$  Partly Flexible '[x]'$  Other  '[]'$  Fixed '[]'$  Flexible '[]'$  Partly Flexible '[]'$  Other  '[]'$  Fixed             '[]'$  Flexible '[]'$  Partly Flexible '[]'$  Other    Position Windswept  ?????  HIPS          '[]'$            '[]'$               '[x]'$    Neutral       Abduct        ADduct         '[x]'$           '[]'$            '[]'$   Neutral Right           Left      '[]'$  Fixed '[]'$  Subluxed '[x]'$  Partly Flexible '[]'$  Dislocated '[]'$  Flexible  '[]'$  Fixed '[]'$  Other '[x]'$  Partly Flexible  '[]'$  Flexible                 Foot Positioning Knee Positioning  ?????    '[x]'$  WFL  '[x]'$ Lt '[x]'$ Rt '[x]'$  WFL  '[x]'$ Lt '[x]'$ Rt    KNEES ROM concerns: ROM concerns:    & Dorsi-Flexed '[]'$ Lt '[]'$ Rt ?????    FEET Plantar Flexed '[]'$ Lt '[]'$ Rt      Inversion                 '[]'$ Lt '[]'$ Rt      Eversion                 '[]'$ Lt '[]'$ Rt     HEAD '[x]'$  Functional '[x]'$  Good  Head Control  ?????  & '[]'$  Flexed         '[]'$  Extended '[]'$  Adequate Head Control    NECK '[]'$  Rotated  Lt  '[]'$  Lat Flexed Lt '[]'$  Rotated  Rt '[]'$  Lat Flexed Rt '[]'$  Limited Head Control     '[]'$  Cervical Hyperextension '[]'$  Absent  Head Control     SHOULDERS ELBOWS WRIST& HAND fingers flexed on bil. hands but pt able to actively extend      Left     Right    Left     Right    Left     Right   U/E '[]'$ Functional           '[]'$ Functional WFL WFL '[]'$ Fisting             '[]'$ Fisting      '[]'$ elev   '[]'$ dep      '[]'$ elev   '[]'$ dep       '[x]'$ pro -'[]'$ retract     '[x]'$ pro  '[]'$ retract '[]'$ subluxed             '[]'$ subluxed           Goals for Wheelchair Mobility  '[]'$  Independence with mobility in the home with motor related ADLs (MRADLs)  '[]'$  Independence with MRADLs in the community '[x]'$  Provide dependent mobility  '[]'$  Provide recline     '[x]'$ Provide tilt   Goals for Seating system '[x]'$  Optimize pressure distribution '[]'$  Provide support needed to facilitate function or safety '[x]'$  Provide corrective forces to assist with maintaining or improving posture '[]'$  Accommodate client's posture:   current seated postures and positions are not flexible or will not tolerate corrective forces '[]'$  Client to be independent with relieving pressure in the wheelchair '[]'$ Enhance physiological function such as breathing, swallowing, digestion  Simulation ideas/Equipment trials:????? State why other equipment was unsuccessful:?????   MOBILITY BASE RECOMMENDATIONS and JUSTIFICATION: MOBILITY COMPONENT JUSTIFICATION  Manufacturer: [employer]Model: Sunrise SR 45   Size: Width 14Seat Depth 20 '[x]'$ provide transport from point A to B      '[]'$ promote Indep mobility  '[x]'$ is not a safe, functional ambulator [  x]walker or cane inadequate '[]'$ non-standard width/depth necessary to accommodate anatomical measurement '[]'$  ?????  '[x]'$ Manual Mobility Base '[x]'$ non-functional ambulator    '[]'$ Scooter/POV  '[]'$ can safely operate  '[]'$ can safely transfer   '[]'$ has adequate trunk stability   '[]'$ cannot functionally propel manual w/c  '[]'$ Power Mobility Base  '[]'$ non-ambulatory  '[]'$ cannot functionally propel manual wheelchair  '[]'$  cannot functionally and safely operate scooter/POV '[]'$ can safely operate and willing to  '[]'$ Stroller Base '[]'$ infant/child  '[]'$ unable to propel manual wheelchair '[]'$ allows for growth '[]'$ non-functional ambulator '[]'$ non-functional UE '[]'$ Indep mobility is not a goal at this time  '[x]'$ Tilt  '[]'$ Forward '[x]'$ Backward '[]'$ Powered tilt  '[x]'$ Manual tilt  '[x]'$ change position against gravitational force on head and shoulders  '[x]'$ change position for pressure relief/cannot weight shift '[]'$ transfers  '[]'$ management of tone '[x]'$ rest periods '[]'$ control edema '[x]'$ facilitate postural control  '[]'$  ?????  '[]'$ Recline  '[]'$ Power recline on power base '[]'$ Manual recline on manual base  '[]'$ accommodate femur to back angle  '[]'$ bring to full recline for ADL care  '[]'$ change position for pressure relief/cannot weight shift '[]'$ rest periods '[]'$ repositioning for transfers or clothing/diaper /catheter changes '[]'$ head positioning  '[]'$ Lighter weight required '[]'$ self- propulsion  '[]'$ lifting '[]'$  ?????  '[]'$ Heavy Duty required '[]'$ user weight greater than 250# '[]'$ extreme tone/ over active movement '[]'$ broken frame on previous chair '[]'$  ?????  '[x]'$  Back  '[]'$  Angle Adjustable '[]'$  Custom molded Jay 3 posterior lateral back  '[x]'$ postural control '[]'$ control of tone/spasticity '[]'$ accommodation of range of motion '[]'$ UE functional control '[]'$ accommodation for seating system '[]'$  ????? '[x]'$ provide lateral trunk support '[]'$ accommodate deformity '[x]'$ provide posterior trunk support '[x]'$ provide lumbar/sacral support '[x]'$ support trunk in midline '[x]'$ Pressure relief over spinal processes  '[x]'$  [place] In[employer] cushion- contoured cushion '[]'$ impaired sensation  '[]'$ decubitus ulcers present '[x]'$ history of pressure ulceration '[]'$ prevent pelvic extension '[x]'$ low maintenance  '[x]'$ stabilize pelvis  '[]'$ accommodate obliquity '[]'$ accommodate  multiple deformity '[x]'$ neutralize lower extremity position '[x]'$ increase pressure distribution '[x]'$  provide pressure relief as pt is at risk for skin breakdown due to boney prominences   '[]'$  Pelvic/thigh support  '[]'$  Lateral thigh guide '[]'$  Distal medial pad  '[]'$  Distal lateral pad '[]'$  pelvis in neutral '[]'$ accommodate pelvis '[]'$  position upper legs '[]'$  alignment '[]'$  accommodate ROM '[]'$  decr adduction '[]'$ accommodate tone '[]'$ removable for transfers '[]'$ decr abduction  '[]'$  Lateral trunk Supports '[]'$  Lt     '[]'$  Rt '[]'$ decrease lateral trunk leaning '[]'$ control tone '[]'$ contour for increased contact '[]'$ safety  '[]'$ accommodate asymmetry '[]'$  ?????  '[x]'$  Mounting hardware  '[]'$ lateral trunk supports  '[x]'$ back   '[x]'$ seat '[x]'$ headrest      '[]'$  thigh support '[]'$ fixed   '[x]'$ swing away '[x]'$ attach seat platform/cushion to w/c frame '[x]'$ attach back cushion to w/c frame '[x]'$ mount postural supports '[x]'$ mount headrest  '[x]'$ swing medial thigh support away '[]'$ swing lateral supports away for transfers  '[]'$  ?????    Armrests  '[]'$ fixed '[x]'$ adjustable height '[]'$ removable   '[]'$ swing away  '[x]'$ flip back   '[]'$ reclining '[x]'$ full length pads '[]'$ desk    '[]'$ pads tubular  '[x]'$ provide support with elbow at 90   '[x]'$ provide support for w/c tray '[x]'$ change of height/angles for variable activities '[]'$ remove for transfers '[x]'$ allow to come closer to table top '[]'$ remove for access to tables '[]'$  ?????  Hangers/ Leg rests  '[]'$ 60 '[x]'$ 70 '[]'$ 90 '[]'$ elevating '[]'$ heavy duty  '[]'$ articulating '[]'$ fixed '[]'$ lift off '[x]'$ swing away     '[]'$ power '[]'$ provide LE support  '[x]'$ accommodate to hamstring tightness '[x]'$ elevate legs during recline   '[]'$ provide change in position for Legs '[]'$ Maintain placement of feet on footplate '[]'$ durability '[x]'$ enable transfers '[]'$ decrease edema '[]'$ Accommodate lower leg length '[]'$  ?????  Foot support Footplate    '[]'$ Lt  '[]'$  Rt  '[]'$   Center mount '[x]'$ flip up     '[x]'$ depth/angle adjustable '[]'$ Amputee adapter    '[]'$  Lt     '[]'$  Rt '[x]'$ provide foot support '[x]'$ accommodate to ankle  ROM '[x]'$ transfers '[]'$ Provide support for residual extremity '[]'$  allow foot to go under wheelchair base '[]'$  decrease tone  '[]'$  ?????  '[x]'$  Ankle strap/heel loops '[x]'$ support foot on foot support '[]'$ decrease extraneous movement '[]'$ provide input to heel  '[]'$ protect foot  Tires: '[]'$ pneumatic  '[x]'$ flat free inserts  '[]'$ solid  '[x]'$ decrease maintenance  '[x]'$ prevent frequent flats '[]'$ increase shock absorbency '[]'$ decrease pain from road shock '[]'$ decrease spasms from road shock '[]'$  ?????  '[x]'$  Headrest  '[]'$ provide posterior head support '[]'$ provide posterior neck support '[]'$ provide lateral head support '[]'$ provide anterior head support '[x]'$ support during tilt and recline '[]'$ improve feeding   '[]'$ improve respiration '[]'$ placement of switches '[x]'$ safety  '[]'$ accommodate ROM  '[]'$ accommodate tone '[]'$ improve visual orientation  '[]'$  Anterior chest strap '[x]'$  Vest '[]'$  Shoulder retractors  '[x]'$ decrease forward movement of shoulder '[]'$ accommodation of TLSO '[x]'$ decrease forward movement of trunk '[]'$ decrease shoulder elevation '[]'$ added abdominal support '[]'$ alignment '[]'$ assistance with shoulder control  '[]'$  ?????  Pelvic Positioner '[x]'$ Belt '[]'$ SubASIS bar '[]'$ Dual Pull '[]'$ stabilize tone '[x]'$ decrease falling out of chair/ **will not Decr potential for sliding due to pelvic tilting '[]'$ prevent excessive rotation '[x]'$ pad for protection over boney prominence '[]'$ prominence comfort '[]'$ special pull angle to control rotation '[x]'$  padded seat belt   Upper Extremity Support '[]'$ L   '[]'$  R '[]'$ Arm trough    '[]'$ hand support '[]'$  tray       '[x]'$ full tray '[]'$ swivel mount '[]'$ decrease edema      '[]'$ decrease subluxation   '[]'$ control tone   '[]'$ placement for AAC/Computer/EADL '[x]'$ decrease gravitational pull on shoulders '[]'$ provide midline positioning '[x]'$ provide support to increase UE function '[]'$ provide hand support in natural position '[x]'$ provide work surface   POWER WHEELCHAIR CONTROLS  '[]'$ Proportional  '[]'$ Non-Proportional Type ????? '[]'$ Left  '[]'$ Right '[]'$ provides access for  controlling wheelchair   '[]'$ lacks motor control to operate proportional drive control '[]'$ unable to understand proportional controls  Actuator Control Module  '[]'$ Single  '[]'$ Multiple   '[]'$ Allow the client to operate the power seat function(s) through the joystick control   '[]'$ Safety Reset Switches '[]'$ Used to change modes and stop the wheelchair when driving in latch mode    '[]'$ Upgraded Electronics   '[]'$ programming for accurate control '[]'$ progressive Disease/changing condition '[]'$ non-proportional drive control needed '[]'$ Needed in order to operate power seat functions through joystick control   '[]'$ Display box '[]'$ Allows user to see in which mode and drive the wheelchair is set  '[]'$ necessary for alternate controls    '[]'$ Digital interface electronics '[]'$ Allows w/c to operate when using alternative drive controls  '[]'$ ASL Head Array '[]'$ Allows client to operate wheelchair  through switches placed in tri-panel headrest  '[]'$ Sip and puff with tubing kit '[]'$ needed to operate sip and puff drive controls  '[]'$ Upgraded tracking electronics '[]'$ increase safety when driving '[]'$ correct tracking when on uneven surfaces  '[]'$ Mount for switches or joystick '[]'$ Attaches switches to w/c  '[]'$ Swing away for access or transfers '[]'$ midline for optimal placement '[]'$ provides for consistent access  '[]'$ Attendant controlled joystick plus mount '[]'$ safety '[]'$ long distance driving '[]'$ operation of seat functions '[]'$ compliance with transportation regulations '[]'$  ?????    Rear wheel placement/Axle adjustability '[]'$ None '[]'$ semi adjustable '[]'$ fully adjustable  '[]'$ improved UE access to wheels '[]'$ improved stability '[]'$ changing angle in space for improvement of postural stability '[]'$ 1-arm drive access '[]'$ amputee pad placement '[]'$  ?????  Wheel rims/ hand rims  '[]'$ metal  '[]'$ plastic coated '[]'$ oblique projections '[]'$ vertical projections '[]'$ Provide ability to propel manual wheelchair  '[]'$  Increase self-propulsion with hand weakness/decreased grasp  Push handles [  x]extended   '[]'$ angle adjustable  '[]'$ standard '[x]'$ caregiver access '[x]'$ caregiver assist '[]'$ allows "hooking" to enable increased ability to perform ADLs or maintain balance  One armed device  '[]'$ Lt   '[]'$ Rt '[]'$ enable propulsion of manual wheelchair with one arm   '[]'$  ?????   Brake/wheel lock extension '[]'$  Lt   '[]'$  Rt '[]'$ increase indep in applying wheel locks   '[]'$ Side guards '[]'$ prevent clothing getting caught in wheel or becoming soiled '[]'$  prevent skin tears/abrasions  Battery: ????? '[]'$ to power wheelchair ?????  Other: Foot attendant brake  Rear anti-tippers caregiver can safely lock wheelchair for transfers and during transit To prevent wheelchair from tipping backwards on inclines and uneven terrain ?????  The above equipment has a life- long use expectancy. Growth and changes in medical and/or functional conditions would be the exceptions. This is to certify that the therapist has no financial relationship with durable medical provider or manufacturer. The therapist will not receive remuneration of any kind for the equipment recommended in this evaluation.   Patient has mobility limitation that significantly impairs safe, timely participation in one or more mobility related ADL's.  (bathing, toileting, feeding, dressing, grooming, moving from room to room)                                                             '[x]'$  Yes '[]'$  No Will mobility device sufficiently improve ability to participate and/or be aided in participation of MRADL's?         '[x]'$  Yes '[]'$  No Can limitation be compensated for with use of a cane or walker?                                                                                '[]'$  Yes '[x]'$  No Does patient or caregiver demonstrate ability/potential ability & willingness to safely use the mobility device?   '[x]'$  Yes '[]'$  No Does patient's home environment support use of recommended mobility device?                                                    '[x]'$  Yes '[]'$  No Does patient have sufficient upper extremity  function necessary to functionally propel a manual wheelchair?    '[]'$  Yes '[x]'$  No Does patient have sufficient strength and trunk stability to safely operate a POV (scooter)?                                  '[]'$  Yes '[x]'$  No Does patient need additional features/benefits provided by a power wheelchair for MRADL's in the home?       '[]'$  Yes '[x]'$  No Does the patient demonstrate the ability to safely use a power wheelchair?                                                              '[]'$   Yes '[x]'$  No  Therapist Name Printed: Guido Sander, PT Date: 01-22-17  Therapist's Signature:   Date:   Supplier's Name Printed: Deberah Pelton, [employer] Date: 01-22-17  Supplier's Signature:   Date:  Patient/Caregiver Signature:   Date:     This is to certify that I have read this evaluation and do agree with the content within:      Physician's Name Printed: Toy Baker, NP  Physician's Signature:  Date:     This is to certify that I, the above signed therapist have the following affiliations: '[]'$  This DME provider '[]'$  Manufacturer of recommended equipment '[]'$  Patient's long term care facility '[x]'$  None of the above                               Plan - 01/22/17 1349    Clinical Impression Statement Pt is a 26 yr old male with CP and idiopathic epilepsy - evaluated for manual tilt in space wheelchair with Deberah Pelton, ATP from [employer]    PT Frequency One time visit   PT Treatment/Interventions ADLs/Self Care Home Management;Patient/family education   Consulted and Agree with Plan of Care Patient;Family member/caregiver      Patient will benefit from skilled therapeutic intervention in order to improve the following deficits and impairments:     Visit Diagnosis: Muscle weakness (generalized) - Plan: PT plan of care cert/re-cert  Other abnormalities of gait and mobility - Plan: PT plan of care cert/re-cert     Problem List There are no active problems to display for this  patient.   GYFVCB, SWHQP RFFMBWG, PT 01/22/2017, 2:03 PM  [facility] [address] [place], [place], [address] Phone: [phone]   Fax:  [phone]  Name: LULA MICHAUX MRN: [id number] Date of Birth: 04/28/1990

## 2017-03-12 ENCOUNTER — Ambulatory Visit (INDEPENDENT_AMBULATORY_CARE_PROVIDER_SITE_OTHER): Payer: 59 | Admitting: Neurology

## 2017-03-12 ENCOUNTER — Encounter: Payer: Self-pay | Admitting: Neurology

## 2017-03-12 VITALS — BP 110/70 | HR 79 | Ht 66.0 in

## 2017-03-12 DIAGNOSIS — G9349 Other encephalopathy: Secondary | ICD-10-CM

## 2017-03-12 DIAGNOSIS — Z5181 Encounter for therapeutic drug level monitoring: Secondary | ICD-10-CM

## 2017-03-12 DIAGNOSIS — G40909 Epilepsy, unspecified, not intractable, without status epilepticus: Secondary | ICD-10-CM | POA: Diagnosis not present

## 2017-03-12 HISTORY — DX: Epilepsy, unspecified, not intractable, without status epilepticus: G40.909

## 2017-03-12 HISTORY — DX: Other encephalopathy: G93.49

## 2017-03-12 MED ORDER — CARBAMAZEPINE ER 100 MG PO CP12: 100.0000 mg | ORAL_CAPSULE | Freq: Two times a day (BID) | ORAL | 3 refills | Status: DC

## 2017-03-12 MED ORDER — LORAZEPAM 1 MG PO TABS: 1.0000 mg | ORAL_TABLET | Freq: Every evening | ORAL | 3 refills | Status: DC | PRN

## 2017-03-12 MED ORDER — LAMOTRIGINE 150 MG PO TABS: 150.0000 mg | ORAL_TABLET | Freq: Two times a day (BID) | ORAL | 3 refills | Status: DC

## 2017-03-12 MED ORDER — CARBAMAZEPINE ER 200 MG PO CP12: 400.0000 mg | ORAL_CAPSULE | Freq: Two times a day (BID) | ORAL | 3 refills | Status: DC

## 2017-03-12 NOTE — Progress Notes (Signed)
Reason for visit: Seizures  Referring physician: Dr. Rosamaria Lintsisovec  Kent Peterson is a 26 y.o. male  History of present illness:  Kent Peterson is a 26 year old white male with a history of viral encephalitis as an infant, he has a seizure disorder associated with this.  The patient has done well with his current medication regimen, he last had a seizure about 9 years ago.  The patient is on a combination of carbamazepine and Lamictal, he appears to tolerate this well.  He comes in today with his mother.  The patient is significantly visually impaired, he is mentally retarded, he is nonverbal.  The patient is a total care individual, he does have aid and assistance that comes in to help him out.  The patient is able to stand and take a few steps with assistance.  He will fall on occasion.  He has no skin breakdown issues.  The patient eats and drinks fairly well.  He has not had any issues with pneumonia.  The patient takes magnesium to improve his bowel regularity.  There is no family history of seizures.  [facility], he comes to this office to be followed through a local neurologist.  He last had blood work done in March 2017.  Past Medical History:  Diagnosis Date  . 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 Seizure disorder (HCC) 03/12/2017  . Seizures (HCC)     Past Surgical History:  Procedure Laterality Date  . right leg surgery      History reviewed. No pertinent family history.  Social history:  reports that  has never smoked. he has never used smokeless tobacco. He reports that he does not drink alcohol or use drugs.  Medications:  Prior to Admission medications   Medication Sig Start Date End Date Taking? Authorizing Provider  carbamazepine (CARBATROL) 100 MG 12 hr capsule Take 100 mg by mouth 2 (two) times daily.   Yes [provider]  carbamazepine (CARBATROL) 200 MG 12 hr  capsule Take 400 mg by mouth 2 (two) times daily.   Yes [provider]  lamoTRIgine (LAMICTAL) 150 MG tablet Take 150 mg by mouth 2 (two) times daily.   Yes [provider]  magnesium gluconate (MAGONATE) 500 MG tablet Take 500 mg by mouth daily.   Yes [provider]  risperiDONE (RISPERDAL) 2 MG tablet Take 2 mg by mouth 2 (two) times daily.   Yes [provider]      Allergies  Allergen Reactions  . Latex Other (See Comments)    Not allergic    ROS:  Out of a complete 14 system review of symptoms, the patient complains only of the following symptoms, and all other reviewed systems are negative.  Loss of vision Constipation Incontinence of the bladder  Blood pressure 110/70, pulse 79, height 5\' 6"  (1.676 m), SpO2 98 %.  Physical Exam  General: The patient is alert, he is nonverbal, unable to cooperate.  He appears to have microcephaly.  Eyes: Pupils are equal, round, and reactive to light.  Neck: The neck is supple, no carotid bruits are noted.  Respiratory: The respiratory examination is clear.  Cardiovascular: The cardiovascular examination reveals a regular rate and rhythm, no obvious murmurs or rubs are noted.  Skin: Extremities are without significant edema.  Neurologic Exam  Mental status: The patient is alert, nonverbal, slightly agitated throughout the exam.  Cranial nerves: Facial  symmetry is present. The strength of the facial muscles and the muscles to head turning and shoulder shrug are normal bilaterally.  The patient is nonverbal, he will vocalize at times.  He has good lateral eye movements, does not blink to threat consistently from either side.  Motor: The motor testing reveals 5 over 5 strength of all 4 extremities. Good symmetric motor tone is noted throughout.  Sensory: Sensory testing is notable that the patient does appear to respond to deep pain stimulation on all fours.  Coordination: Cerebellar testing  cannot be performed, the patient will not follow directions for cerebellar testing.  Gait and station: The patient is able to stand with assistance and take several steps.  He walks with the knees slightly flexed.  Reflexes: Deep tendon reflexes are symmetric and normal bilaterally. Toes are downgoing bilaterally.   Assessment/Plan:  1.  History of viral encephalitis, subsequent chronic stable encephalopathy.  2.  History of seizures  The seizures have been well controlled on the current medication regimen.  We will continue the carbamazepine and Lamictal at the current doses.  A prescription was given for the carbamazepine, Lamictal, and for Ativan 1 mg tablets to take if needed for sleep at times.  The patient does have some underlying agitation issues.  He will follow-up in 1 year, sooner if needed.   Marlan Palau. Keith Colletta Spillers MD 03/12/2017 8:31 AM  [facility] [address] [place], [place] [id number]  Phone [phone] Fax [phone]

## 2017-03-13 LAB — COMPREHENSIVE METABOLIC PANEL
ALBUMIN: 5 g/dL (ref 3.5–5.5)
ALK PHOS: 114 IU/L (ref 39–117)
ALT: 35 IU/L (ref 0–44)
AST: 27 IU/L (ref 0–40)
Albumin/Globulin Ratio: 2.5 — ABNORMAL HIGH (ref 1.2–2.2)
BUN / CREAT RATIO: 18 (ref 9–20)
BUN: 12 mg/dL (ref 6–20)
Bilirubin Total: 0.4 mg/dL (ref 0.0–1.2)
CO2: 28 mmol/L (ref 20–29)
CREATININE: 0.68 mg/dL — AB (ref 0.76–1.27)
Calcium: 9.4 mg/dL (ref 8.7–10.2)
Chloride: 101 mmol/L (ref 96–106)
GFR calc Af Amer: 150 mL/min/{1.73_m2} (ref >59)
GFR calc non Af Amer: 130 mL/min/{1.73_m2} (ref >59)
GLUCOSE: 112 mg/dL — AB (ref 65–99)
Globulin, Total: 2 g/dL (ref 1.5–4.5)
Potassium: 3.8 mmol/L (ref 3.5–5.2)
Sodium: 142 mmol/L (ref 134–144)
TOTAL PROTEIN: 7 g/dL (ref 6.0–8.5)

## 2017-03-13 LAB — CBC WITH DIFFERENTIAL/PLATELET
BASOS ABS: 0 10*3/uL (ref 0.0–0.2)
Basos: 1 %
EOS (ABSOLUTE): 0.1 10*3/uL (ref 0.0–0.4)
EOS: 5 %
HEMATOCRIT: 39.9 % (ref 37.5–51.0)
HEMOGLOBIN: 14.3 g/dL (ref 13.0–17.7)
IMMATURE GRANS (ABS): 0 10*3/uL (ref 0.0–0.1)
IMMATURE GRANULOCYTES: 0 %
LYMPHS ABS: 0.8 10*3/uL (ref 0.7–3.1)
LYMPHS: 30 %
MCH: 32.3 pg (ref 26.6–33.0)
MCHC: 35.8 g/dL — AB (ref 31.5–35.7)
MCV: 90 fL (ref 79–97)
MONOCYTES: 9 %
Monocytes Absolute: 0.2 10*3/uL (ref 0.1–0.9)
Neutrophils Absolute: 1.4 10*3/uL (ref 1.4–7.0)
Neutrophils: 55 %
Platelets: 173 10*3/uL (ref 150–379)
RBC: 4.43 x10E6/uL (ref 4.14–5.80)
RDW: 13.8 % (ref 12.3–15.4)
WBC: 2.6 10*3/uL — AB (ref 3.4–10.8)

## 2017-03-13 LAB — CARBAMAZEPINE LEVEL, TOTAL: CARBAMAZEPINE LVL: 11.2 ug/mL (ref 4.0–12.0)

## 2017-03-13 LAB — LAMOTRIGINE LEVEL: LAMOTRIGINE LVL: 3.7 ug/mL (ref 2.0–20.0)

## 2017-03-14 ENCOUNTER — Telehealth: Payer: Self-pay | Admitting: *Deleted

## 2017-03-14 NOTE — Telephone Encounter (Signed)
Called and spoke with mother, Jasmine DecemberSharon. Relayed results per CW,MD note. She verbalized understanding.

## 2017-03-14 NOTE — Telephone Encounter (Signed)
-----   Message from York Spanielharles K Willis, MD sent at 03/13/2017  4:55 PM EST ----- Blood work is unremarkable with exception that there is a chronic stable low white blood count, likely from carbamazepine use.  Carbamazepine level and Lamictal level are therapeutic, no change in dosing.  Please call the mother. ----- Message ----- From: Nell RangeInterface, [facility] Lab Results In Sent: 03/13/2017   7:43 AM To: York Spanielharles K Willis, MD

## 2017-05-07 DIAGNOSIS — R2689 Other abnormalities of gait and mobility: Secondary | ICD-10-CM | POA: Diagnosis not present

## 2017-05-07 DIAGNOSIS — Z7409 Other reduced mobility: Secondary | ICD-10-CM | POA: Diagnosis not present

## 2017-07-14 DIAGNOSIS — G809 Cerebral palsy, unspecified: Secondary | ICD-10-CM | POA: Diagnosis not present

## 2017-11-06 IMAGING — DX DG CHEST 1V PORT
1 series · 1 of 1 positions shown · non-contrast
Comparison: Chest radiograph April 21, 2010

CLINICAL DATA: Agitation, decreased urinary output.

EXAM:
PORTABLE CHEST 1 VIEW

[chest ap]
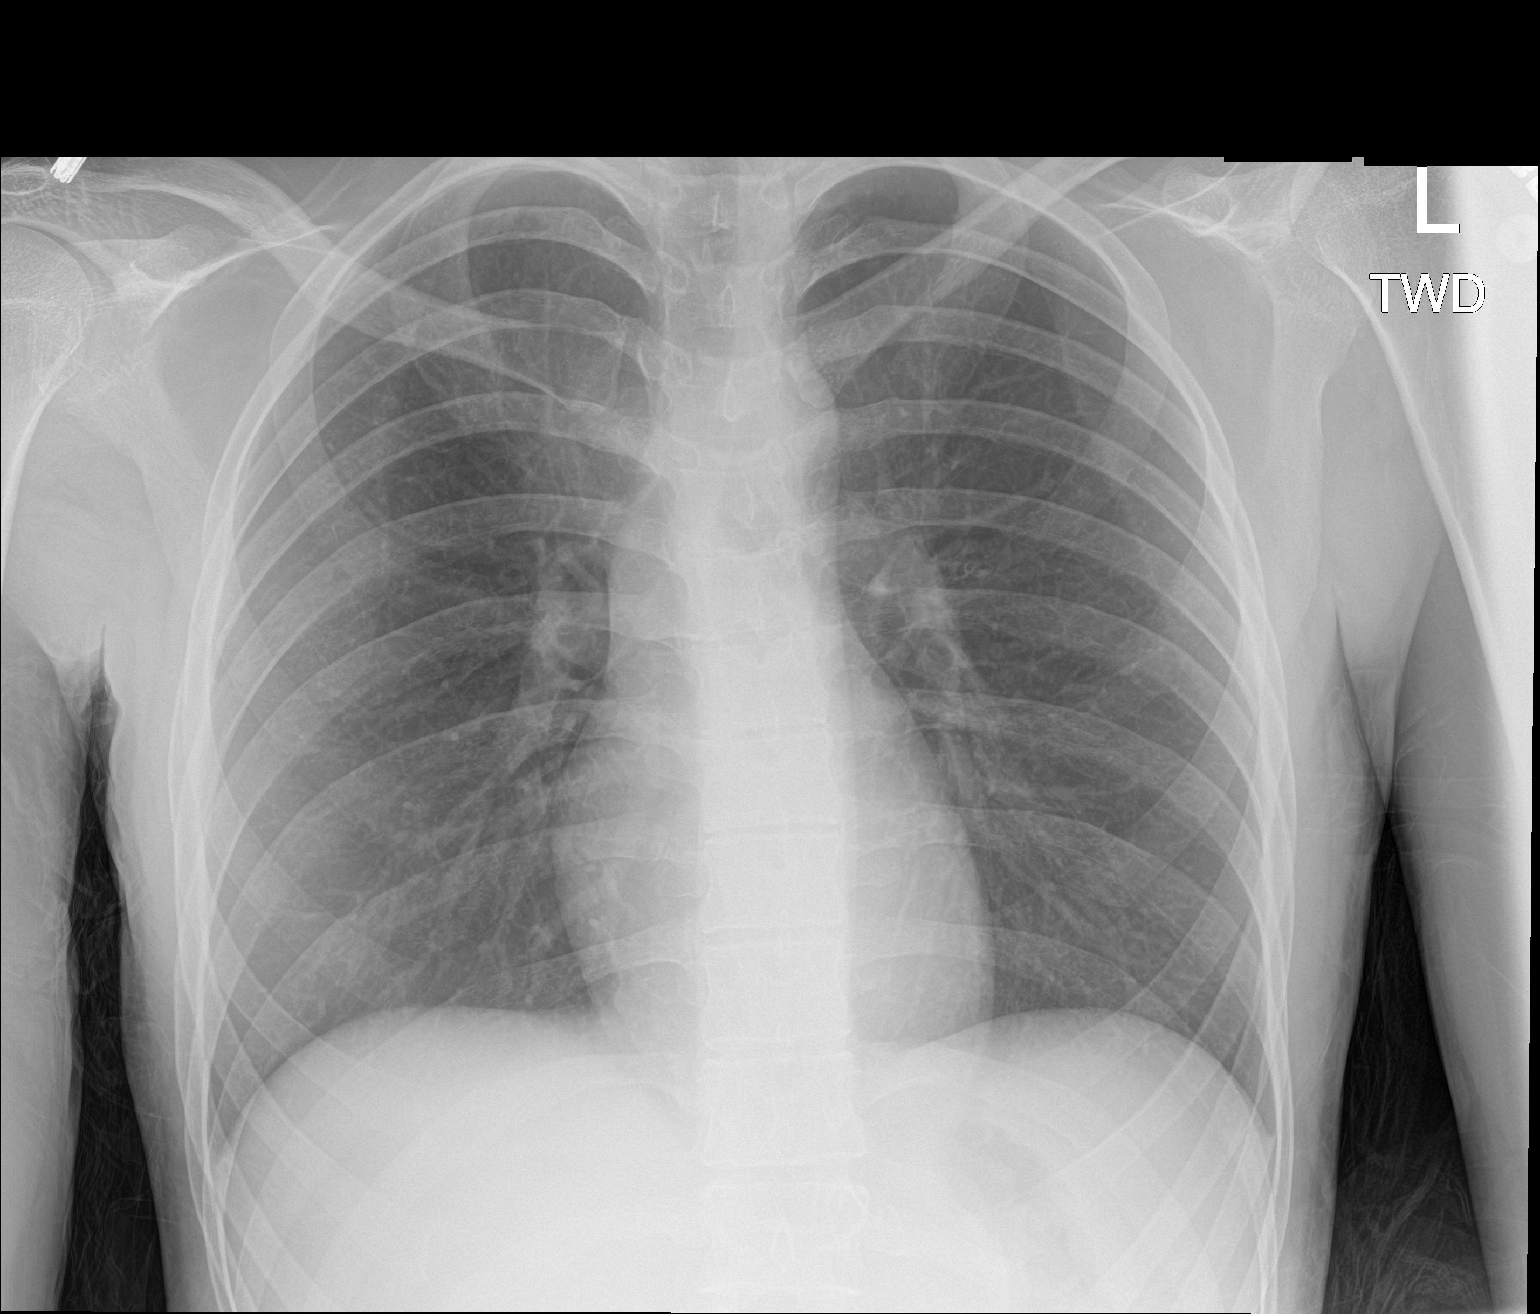

[1 of 1 positions shown; findings below may reference images not displayed]

FINDINGS: Cardiomediastinal silhouette is normal. The lungs are clear without
pleural effusions or focal consolidations. Trachea projects midline
and there is no pneumothorax. Soft tissue planes and included
osseous structures are non-suspicious.
IMPRESSION: Normal chest.

## 2017-11-19 DIAGNOSIS — R159 Full incontinence of feces: Secondary | ICD-10-CM | POA: Diagnosis not present

## 2017-11-19 DIAGNOSIS — R32 Unspecified urinary incontinence: Secondary | ICD-10-CM | POA: Diagnosis not present

## 2017-11-20 DIAGNOSIS — R159 Full incontinence of feces: Secondary | ICD-10-CM | POA: Diagnosis not present

## 2017-11-20 DIAGNOSIS — R32 Unspecified urinary incontinence: Secondary | ICD-10-CM | POA: Diagnosis not present

## 2017-12-22 DIAGNOSIS — Z23 Encounter for immunization: Secondary | ICD-10-CM | POA: Diagnosis not present

## 2018-01-17 DIAGNOSIS — R159 Full incontinence of feces: Secondary | ICD-10-CM | POA: Diagnosis not present

## 2018-01-17 DIAGNOSIS — R32 Unspecified urinary incontinence: Secondary | ICD-10-CM | POA: Diagnosis not present

## 2018-01-19 DIAGNOSIS — R159 Full incontinence of feces: Secondary | ICD-10-CM | POA: Diagnosis not present

## 2018-01-19 DIAGNOSIS — R32 Unspecified urinary incontinence: Secondary | ICD-10-CM | POA: Diagnosis not present

## 2018-03-16 ENCOUNTER — Ambulatory Visit: Payer: 59 | Admitting: Adult Health

## 2018-03-20 DIAGNOSIS — R159 Full incontinence of feces: Secondary | ICD-10-CM | POA: Diagnosis not present

## 2018-03-20 DIAGNOSIS — R32 Unspecified urinary incontinence: Secondary | ICD-10-CM | POA: Diagnosis not present

## 2018-03-21 DIAGNOSIS — R159 Full incontinence of feces: Secondary | ICD-10-CM | POA: Diagnosis not present

## 2018-03-21 DIAGNOSIS — R32 Unspecified urinary incontinence: Secondary | ICD-10-CM | POA: Diagnosis not present

## 2018-03-23 ENCOUNTER — Encounter: Payer: Self-pay | Admitting: Adult Health

## 2018-03-23 ENCOUNTER — Ambulatory Visit (INDEPENDENT_AMBULATORY_CARE_PROVIDER_SITE_OTHER): Payer: 59 | Admitting: Adult Health

## 2018-03-23 VITALS — BP 109/71 | HR 85 | Ht 66.0 in

## 2018-03-23 DIAGNOSIS — Z5181 Encounter for therapeutic drug level monitoring: Secondary | ICD-10-CM

## 2018-03-23 DIAGNOSIS — G40909 Epilepsy, unspecified, not intractable, without status epilepticus: Secondary | ICD-10-CM | POA: Diagnosis not present

## 2018-03-23 MED ORDER — CARBAMAZEPINE ER 200 MG PO CP12: 400.0000 mg | ORAL_CAPSULE | Freq: Two times a day (BID) | ORAL | 3 refills | Status: DC

## 2018-03-23 MED ORDER — CARBAMAZEPINE ER 100 MG PO CP12: 100.0000 mg | ORAL_CAPSULE | Freq: Two times a day (BID) | ORAL | 3 refills | Status: DC

## 2018-03-23 MED ORDER — LAMOTRIGINE 150 MG PO TABS: 150.0000 mg | ORAL_TABLET | Freq: Two times a day (BID) | ORAL | 3 refills | Status: DC

## 2018-03-23 NOTE — Progress Notes (Signed)
PATIENT: Kent Peterson DOB: 04/09/1990  REASON FOR VISIT: follow up HISTORY FROM: patient  HISTORY OF PRESENT ILLNESS: Today 03/23/18:  Kent Peterson is a 27 year old male with a history of viral encephalitis with associated seizures.  He returns today for follow-up.  He is currently on carbamazepine and Lamictal.  His mother reports that he has not had any seizure events.  He continues to tolerate the medication well.  The patient primarily uses a wheelchair when ambulating.  His mother states that at home he can stand with assistance.  She reports that he typically does not follow commands and is visually impaired.  He returns today for evaluation.  HISTORY Kent Peterson is a 27 year old white male with a history of viral encephalitis as an infant, he has a seizure disorder associated with this.  The patient has done well with his current medication regimen, he last had a seizure about 9 years ago.  The patient is on a combination of carbamazepine and Lamictal, he appears to tolerate this well.  He comes in today with his mother.  The patient is significantly visually impaired, he is mentally retarded, he is nonverbal.  The patient is a total care individual, he does have aid and assistance that comes in to help him out.  The patient is able to stand and take a few steps with assistance.  He will fall on occasion.  He has no skin breakdown issues.  The patient eats and drinks fairly well.  He has not had any issues with pneumonia.  The patient takes magnesium to improve his bowel regularity.  There is no family history of seizures.  The [facility] has been [facility], he comes to this office to be followed through a local neurologist.  He last had blood work done in March 2017.  REVIEW OF SYSTEMS: Out of a complete 14 system review of symptoms, the patient complains only of the following symptoms, and all other reviewed systems are negative.  ALLERGIES: Allergies  Allergen  Reactions  . Latex Other (See Comments)    Not allergic    HOME MEDICATIONS: Outpatient Medications Prior to Visit  Medication Sig Dispense Refill  . carbamazepine (CARBATROL) 100 MG 12 hr capsule Take 1 capsule (100 mg total) by mouth 2 (two) times daily. 180 capsule 3  . carbamazepine (CARBATROL) 200 MG 12 hr capsule Take 2 capsules (400 mg total) by mouth 2 (two) times daily. 360 capsule 3  . lamoTRIgine (LAMICTAL) 150 MG tablet Take 1 tablet (150 mg total) by mouth 2 (two) times daily. 180 tablet 3  . LORazepam (ATIVAN) 1 MG tablet Take 1 tablet (1 mg total) by mouth at bedtime as needed for anxiety. 30 tablet 3  . magnesium gluconate (MAGONATE) 500 MG tablet Take 500 mg by mouth daily.    . risperiDONE (RISPERDAL) 2 MG tablet Take 2 mg by mouth 2 (two) times daily.     No facility-administered medications prior to visit.     PAST MEDICAL HISTORY: Past Medical History:  Diagnosis Date  . 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 Seizure disorder (HCC) 03/12/2017  . Seizures (HCC)     PAST SURGICAL HISTORY: Past Surgical History:  Procedure Laterality Date  . right leg surgery      FAMILY HISTORY: Family History  Problem Relation Age of Onset  . Diabetes Mother   . Hypercholesterolemia Mother   . Hypertension Father   . Healthy  Brother     SOCIAL HISTORY: Social History   Socioeconomic History  . Marital status: Single    Spouse name: Not on file  . Number of children: 0  . Years of education: 112  . Highest education level: Not on file  Occupational History  . Not on file  Social Needs  . Financial resource strain: Not on file  . Food insecurity:    Worry: Not on file    Inability: Not on file  . Transportation needs:    Medical: Not on file    Non-medical: Not on file  Tobacco Use  . Smoking status: Never Smoker  . Smokeless tobacco: Never Used  Substance and Sexual Activity  . Alcohol use: No  . Drug  use: No  . Sexual activity: Not on file  Lifestyle  . Physical activity:    Days per week: Not on file    Minutes per session: Not on file  . Stress: Not on file  Relationships  . Social connections:    Talks on phone: Not on file    Gets together: Not on file    Attends religious service: Not on file    Active member of club or organization: Not on file    Attends meetings of clubs or organizations: Not on file    Relationship status: Not on file  . Intimate partner violence:    Fear of current or ex partner: Not on file    Emotionally abused: Not on file    Physically abused: Not on file    Forced sexual activity: Not on file  Other Topics Concern  . Not on file  Social History Narrative   Lives with parents   Caffeine use:    none      PHYSICAL EXAM  Vitals:   03/23/18 0925  BP: 109/71  Pulse: 85  Height: 5\' 6"  (1.676 m)   Body mass index is 16.46 kg/m.  Generalized: Well developed, in no acute distress   Neurological examination  Mentation: Alert.  Patient does not follow commands. Cranial nerve II-XII: Pupils were equal round reactive to light. Extraocular movements were full,  Motor: The motor testing reveals 5 over 5 strength in the upper extremities.  Unable to test strength in the lower extremities.  Coordination: Unable to test. Gait and station: Patient is in a wheelchair.   DIAGNOSTIC DATA (LABS, IMAGING, TESTING) - I reviewed patient records, labs, notes, testing and imaging myself where available.  Lab Results  Component Value Date   WBC 2.6 (L) 03/12/2017   HGB 14.3 03/12/2017   HCT 39.9 03/12/2017   MCV 90 03/12/2017   PLT 173 03/12/2017      Component Value Date/Time   NA 142 03/12/2017 0857   K 3.8 03/12/2017 0857   CL 101 03/12/2017 0857   CO2 28 03/12/2017 0857   GLUCOSE 112 (H) 03/12/2017 0857   GLUCOSE 101 (H) 11/13/2015 1237   BUN 12 03/12/2017 0857   CREATININE 0.68 (L) 03/12/2017 0857   CALCIUM 9.4 03/12/2017 0857    PROT 7.0 03/12/2017 0857   ALBUMIN 5.0 03/12/2017 0857   AST 27 03/12/2017 0857   ALT 35 03/12/2017 0857   ALKPHOS 114 03/12/2017 0857   BILITOT 0.4 03/12/2017 0857   GFRNONAA 130 03/12/2017 0857   GFRAA 150 03/12/2017 0857     ASSESSMENT AND PLAN 27 y.o. year old male  has a past medical history of Chronic static encephalopathy (03/12/2017), Seizure disorder (HCC) (03/12/2017), and  Seizures (HCC). here with:  1.  Seizures  The patient will continue on Lamictal and carbamazepine.  I will check blood work today.  I have advised the patient and his mother that if he has any seizure events he should let us know.  He will follow-up in 6 months or sooner if needed.  I spent 15 minutes with the patient. 50% of this time was spent reviewing plan of care   Butch Penny, MSN, NP-C 03/23/2018, 9:46 AM [facility] [address], [address] [place], [name] [address] [phone]

## 2018-03-23 NOTE — Patient Instructions (Signed)
Your Plan:  Continue Lamictal, Carbamazepine and ativan Blood work today If your symptoms worsen or you develop new symptoms please let us know.    Thank you for coming to see [facility] at [facility]. I hope we have been able to provide you high quality care today.  You may receive a patient satisfaction survey over the next few weeks. We would appreciate your feedback and comments so that we may continue to improve ourselves and the health of our patients.

## 2018-03-23 NOTE — Progress Notes (Signed)
I have read the note, and I agree with the clinical assessment and plan.  Charnelle Bergeman K Octavie Westerhold   

## 2018-03-26 LAB — COMPREHENSIVE METABOLIC PANEL
ALBUMIN: 4.6 g/dL (ref 3.5–5.5)
ALK PHOS: 109 IU/L (ref 39–117)
ALT: 31 IU/L (ref 0–44)
AST: 24 IU/L (ref 0–40)
Albumin/Globulin Ratio: 2.2 (ref 1.2–2.2)
BUN / CREAT RATIO: 16 (ref 9–20)
BUN: 12 mg/dL (ref 6–20)
Bilirubin Total: 0.4 mg/dL (ref 0.0–1.2)
CO2: 26 mmol/L (ref 20–29)
CREATININE: 0.77 mg/dL (ref 0.76–1.27)
Calcium: 9.5 mg/dL (ref 8.7–10.2)
Chloride: 103 mmol/L (ref 96–106)
GFR calc Af Amer: 142 mL/min/{1.73_m2} (ref >59)
GFR, EST NON AFRICAN AMERICAN: 123 mL/min/{1.73_m2} (ref >59)
GLOBULIN, TOTAL: 2.1 g/dL (ref 1.5–4.5)
Glucose: 101 mg/dL — ABNORMAL HIGH (ref 65–99)
Potassium: 3.8 mmol/L (ref 3.5–5.2)
SODIUM: 144 mmol/L (ref 134–144)
Total Protein: 6.7 g/dL (ref 6.0–8.5)

## 2018-03-26 LAB — CBC WITH DIFFERENTIAL/PLATELET
BASOS: 1 %
Basophils Absolute: 0 10*3/uL (ref 0.0–0.2)
EOS (ABSOLUTE): 0.1 10*3/uL (ref 0.0–0.4)
Eos: 2 %
HEMATOCRIT: 38.1 % (ref 37.5–51.0)
Hemoglobin: 14 g/dL (ref 13.0–17.7)
IMMATURE GRANULOCYTES: 0 %
Immature Grans (Abs): 0 10*3/uL (ref 0.0–0.1)
LYMPHS ABS: 0.9 10*3/uL (ref 0.7–3.1)
Lymphs: 32 %
MCH: 32.3 pg (ref 26.6–33.0)
MCHC: 36.7 g/dL — ABNORMAL HIGH (ref 31.5–35.7)
MCV: 88 fL (ref 79–97)
MONOCYTES: 8 %
Monocytes Absolute: 0.2 10*3/uL (ref 0.1–0.9)
NEUTROS PCT: 57 %
Neutrophils Absolute: 1.5 10*3/uL (ref 1.4–7.0)
Platelets: 192 10*3/uL (ref 150–450)
RBC: 4.33 x10E6/uL (ref 4.14–5.80)
RDW: 12.4 % (ref 12.3–15.4)
WBC: 2.7 10*3/uL — AB (ref 3.4–10.8)

## 2018-03-26 LAB — LAMOTRIGINE LEVEL: LAMOTRIGINE LVL: 3.7 ug/mL (ref 2.0–20.0)

## 2018-03-26 LAB — CARBAMAZEPINE LEVEL, TOTAL: CARBAMAZEPINE LVL: 8.4 ug/mL (ref 4.0–12.0)

## 2018-03-30 ENCOUNTER — Telehealth: Payer: Self-pay | Admitting: *Deleted

## 2018-03-30 NOTE — Telephone Encounter (Signed)
Spoke with mother and informed her the patient's blood work is consistent with previous blood work. She verbalized understanding, appreciation.

## 2018-05-08 DIAGNOSIS — R2689 Other abnormalities of gait and mobility: Secondary | ICD-10-CM | POA: Diagnosis not present

## 2018-05-08 DIAGNOSIS — Z7409 Other reduced mobility: Secondary | ICD-10-CM | POA: Diagnosis not present

## 2018-05-20 ENCOUNTER — Other Ambulatory Visit: Payer: Self-pay | Admitting: Neurology

## 2018-07-18 DIAGNOSIS — R159 Full incontinence of feces: Secondary | ICD-10-CM | POA: Diagnosis not present

## 2018-07-18 DIAGNOSIS — R32 Unspecified urinary incontinence: Secondary | ICD-10-CM | POA: Diagnosis not present

## 2018-11-20 ENCOUNTER — Other Ambulatory Visit: Payer: Self-pay | Admitting: Neurology

## 2019-03-29 ENCOUNTER — Ambulatory Visit: Payer: 59 | Admitting: Adult Health

## 2019-05-05 ENCOUNTER — Other Ambulatory Visit: Payer: Self-pay | Admitting: Adult Health

## 2019-06-08 ENCOUNTER — Other Ambulatory Visit: Payer: Self-pay | Admitting: Adult Health

## 2019-06-14 ENCOUNTER — Other Ambulatory Visit: Payer: Self-pay

## 2019-06-14 ENCOUNTER — Encounter: Payer: Self-pay | Admitting: Adult Health

## 2019-06-14 ENCOUNTER — Ambulatory Visit (INDEPENDENT_AMBULATORY_CARE_PROVIDER_SITE_OTHER): Payer: 59 | Admitting: Adult Health

## 2019-06-14 VITALS — BP 139/88 | HR 84 | Temp 97.1°F | Ht 65.0 in

## 2019-06-14 DIAGNOSIS — G40909 Epilepsy, unspecified, not intractable, without status epilepticus: Secondary | ICD-10-CM

## 2019-06-14 NOTE — Progress Notes (Signed)
PATIENT: Kent Peterson DOB: 1991/02/13  REASON FOR VISIT: follow up HISTORY FROM: Mother  HISTORY OF PRESENT ILLNESS: Today 06/14/19:  Mr. Scholze is a 29 year old male with a history of viral encephalitis with associated seizures.  He returns today with his mother.  He remains on carbamazepine and Lamictal.  Mother denies any seizure events.  Reports that he tolerates the medication well.  Denies any new issues.  Returns today for follow-up.  HISTORY 03/23/18:  Mr. Maciolek is a 29 year old male with a history of viral encephalitis with associated seizures.  He returns today for follow-up.  He is currently on carbamazepine and Lamictal.  His mother reports that he has not had any seizure events.  He continues to tolerate the medication well.  The patient primarily uses a wheelchair when ambulating.  His mother states that at home he can stand with assistance.  She reports that he typically does not follow commands and is visually impaired.  He returns today for evaluation.  REVIEW OF SYSTEMS: Out of a complete 14 system review of symptoms, the patient complains only of the following symptoms, and all other reviewed systems are negative.  See HPI  ALLERGIES: Allergies  Allergen Reactions  . Latex Other (See Comments)    Not allergic    HOME MEDICATIONS: Outpatient Medications Prior to Visit  Medication Sig Dispense Refill  . carbamazepine (CARBATROL) 100 MG 12 hr capsule TAKE 1 CAPSULE BY MOUTH TWICE A DAY 120 capsule 0  . carbamazepine (CARBATROL) 200 MG 12 hr capsule TAKE 2 CAPSULES BY MOUTH TWICE A DAY 240 capsule 0  . lamoTRIgine (LAMICTAL) 150 MG tablet TAKE 1 TABLET BY MOUTH TWICE A DAY 180 tablet 1  . LORazepam (ATIVAN) 1 MG tablet TAKE 1 TABLET BY MOUTH AT BEDTIME AS NEEDED FOR ANXIETY 30 tablet 1  . magnesium gluconate (MAGONATE) 500 MG tablet Take 500 mg by mouth daily.    . risperiDONE (RISPERDAL) 2 MG tablet Take 2 mg by mouth 2 (two) times daily.     No  facility-administered medications prior to visit.    PAST MEDICAL HISTORY: Past Medical History:  Diagnosis Date  . 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 Seizure disorder ([place]) 03/12/2017  . Seizures ([place])     PAST SURGICAL HISTORY: Past Surgical History:  Procedure Laterality Date  . right leg surgery      FAMILY HISTORY: Family History  Problem Relation Age of Onset  . Diabetes Mother   . Hypercholesterolemia Mother   . Hypertension Father   . Healthy Brother     SOCIAL HISTORY: Social History   Socioeconomic History  . Marital status: Single    Spouse name: Not on file  . Number of children: 0  . Years of education: 51  . Highest education level: Not on file  Occupational History  . Not on file  Tobacco Use  . Smoking status: Never Smoker  . Smokeless tobacco: Never Used  Substance and Sexual Activity  . Alcohol use: No  . Drug use: No  . Sexual activity: Not on file  Other Topics Concern  . Not on file  Social History Narrative   Lives with parents   Caffeine use:    none   Social Determinants of Health   Financial Resource Strain:   . Difficulty of Paying Living Expenses:   Food Insecurity:   . Worried About Charity fundraiser in the Last Year:   .  Ran Out of Food in the Last Year:   Transportation Needs:   . Freight forwarder (Medical):   Marland Kitchen Lack of Transportation (Non-Medical):   Physical Activity:   . Days of Exercise per Week:   . Minutes of Exercise per Session:   Stress:   . Feeling of Stress :   Social Connections:   . Frequency of Communication with Friends and Family:   . Frequency of Social Gatherings with Friends and Family:   . Attends Religious Services:   . Active Member of Clubs or Organizations:   . Attends Banker Meetings:   Marland Kitchen Marital Status:   Intimate Partner Violence:   . Fear of Current or Ex-Partner:   . Emotionally Abused:   Marland Kitchen Physically Abused:    . Sexually Abused:       PHYSICAL EXAM  Vitals:   06/14/19 0849  BP: 139/88  Pulse: 84  Temp: (!) 97.1 F (36.2 C)  Height: 5\' 5"  (1.651 m)   Body mass index is 16.97 kg/m.  Generalized: Well developed, in no acute distress   Neurological examination  Mentation: Alert.  Does not follow commands.  Nonverbal Cranial nerve II-XII: Pupils were equal round reactive to light. Extraocular movements were full, visual field were full on confrontational test.  Motor: Good strength in the upper extremities.  Unable to test in the lower..  Sensory: Unable to test Coordination: Unable to test Gait and station: In a wheelchair   DIAGNOSTIC DATA (LABS, IMAGING, TESTING) - I reviewed patient records, labs, notes, testing and imaging myself where available.  Lab Results  Component Value Date   WBC 2.7 (L) 03/23/2018   HGB 14.0 03/23/2018   HCT 38.1 03/23/2018   MCV 88 03/23/2018   PLT 192 03/23/2018      Component Value Date/Time   NA 144 03/23/2018 1005   K 3.8 03/23/2018 1005   CL 103 03/23/2018 1005   CO2 26 03/23/2018 1005   GLUCOSE 101 (H) 03/23/2018 1005   GLUCOSE 101 (H) 11/13/2015 1237   BUN 12 03/23/2018 1005   CREATININE 0.77 03/23/2018 1005   CALCIUM 9.5 03/23/2018 1005   PROT 6.7 03/23/2018 1005   ALBUMIN 4.6 03/23/2018 1005   AST 24 03/23/2018 1005   ALT 31 03/23/2018 1005   ALKPHOS 109 03/23/2018 1005   BILITOT 0.4 03/23/2018 1005   GFRNONAA 123 03/23/2018 1005   GFRAA 142 03/23/2018 1005      ASSESSMENT AND PLAN 29 y.o. year old male  has a past medical history of Chronic static encephalopathy (03/12/2017), Seizure disorder (HCC) (03/12/2017), and Seizures (HCC). here with:  1.  Seizures  -Continue carbamazepine and Lamictal -Blood work today -Follow-up in 1 year or sooner if needed   I spent 20 minutes of face-to-face and non-face-to-face time with patient.  This included previsit chart review, lab review, study review, order entry,  electronic health record documentation, patient education.  03/14/2017, MSN, NP-C 06/14/2019, 9:16 AM [facility] [address] [place], [name] [phone]

## 2019-06-14 NOTE — Progress Notes (Signed)
I have read the note, and I agree with the clinical assessment and plan.  Chaniyah Jahr K Royetta Probus   

## 2019-06-14 NOTE — Patient Instructions (Signed)
Your Plan:  Continue Carbamazepine and Lamictal  If your symptoms worsen or you develop new symptoms please let us know.   Thank you for coming to see [place] at [facility]. I hope we have been able to provide you high quality care today.  You may receive a patient satisfaction survey over the next few weeks. We would appreciate your feedback and comments so that we may continue to improve ourselves and the health of our patients.

## 2019-06-15 LAB — CBC WITH DIFFERENTIAL/PLATELET
Basophils Absolute: 0 10*3/uL (ref 0.0–0.2)
Basos: 0 %
EOS (ABSOLUTE): 0.1 10*3/uL (ref 0.0–0.4)
Eos: 4 %
Hematocrit: 41.5 % (ref 37.5–51.0)
Hemoglobin: 14.3 g/dL (ref 13.0–17.7)
Immature Grans (Abs): 0 10*3/uL (ref 0.0–0.1)
Immature Granulocytes: 0 %
Lymphocytes Absolute: 0.8 10*3/uL (ref 0.7–3.1)
Lymphs: 31 %
MCH: 32.2 pg (ref 26.6–33.0)
MCHC: 34.5 g/dL (ref 31.5–35.7)
MCV: 94 fL (ref 79–97)
Monocytes Absolute: 0.2 10*3/uL (ref 0.1–0.9)
Monocytes: 9 %
Neutrophils Absolute: 1.4 10*3/uL (ref 1.4–7.0)
Neutrophils: 56 %
Platelets: 179 10*3/uL (ref 150–450)
RBC: 4.44 x10E6/uL (ref 4.14–5.80)
RDW: 12.5 % (ref 11.6–15.4)
WBC: 2.5 10*3/uL — CL (ref 3.4–10.8)

## 2019-06-15 LAB — COMPREHENSIVE METABOLIC PANEL
ALT: 45 IU/L — ABNORMAL HIGH (ref 0–44)
AST: 31 IU/L (ref 0–40)
Albumin/Globulin Ratio: 2.4 — ABNORMAL HIGH (ref 1.2–2.2)
Albumin: 4.8 g/dL (ref 4.1–5.2)
Alkaline Phosphatase: 114 IU/L (ref 39–117)
BUN/Creatinine Ratio: 15 (ref 9–20)
BUN: 13 mg/dL (ref 6–20)
Bilirubin Total: 0.3 mg/dL (ref 0.0–1.2)
CO2: 29 mmol/L (ref 20–29)
Calcium: 9.5 mg/dL (ref 8.7–10.2)
Chloride: 101 mmol/L (ref 96–106)
Creatinine, Ser: 0.86 mg/dL (ref 0.76–1.27)
GFR calc Af Amer: 135 mL/min/{1.73_m2} (ref >59)
GFR calc non Af Amer: 116 mL/min/{1.73_m2} (ref >59)
Globulin, Total: 2 g/dL (ref 1.5–4.5)
Glucose: 85 mg/dL (ref 65–99)
Potassium: 3.9 mmol/L (ref 3.5–5.2)
Sodium: 144 mmol/L (ref 134–144)
Total Protein: 6.8 g/dL (ref 6.0–8.5)

## 2019-06-15 LAB — CARBAMAZEPINE LEVEL, TOTAL: Carbamazepine (Tegretol), S: 10.7 ug/mL (ref 4.0–12.0)

## 2019-06-15 LAB — LAMOTRIGINE LEVEL: Lamotrigine Lvl: 5.8 ug/mL (ref 2.0–20.0)

## 2019-06-16 ENCOUNTER — Telehealth: Payer: Self-pay | Admitting: *Deleted

## 2019-06-16 NOTE — Telephone Encounter (Signed)
-----   Message from Butch Penny, NP sent at 06/15/2019  3:34 PM EDT ----- Labs results are unremarkable with the exception that white blood count is low.  This could be due to the Lamictal.  It has been low for the last 8 years.  Please call patient with results.

## 2019-06-16 NOTE — Telephone Encounter (Signed)
I spoke to his mother. She verbalized understanding of the patient's lab results.

## 2019-06-21 ENCOUNTER — Other Ambulatory Visit: Payer: Self-pay | Admitting: Adult Health

## 2019-08-16 ENCOUNTER — Other Ambulatory Visit: Payer: Self-pay | Admitting: Adult Health

## 2019-09-09 ENCOUNTER — Other Ambulatory Visit: Payer: Self-pay | Admitting: *Deleted

## 2019-09-09 MED ORDER — CARBAMAZEPINE ER 200 MG PO CP12: 400.0000 mg | ORAL_CAPSULE | Freq: Two times a day (BID) | ORAL | 3 refills | Status: DC

## 2019-11-09 ENCOUNTER — Other Ambulatory Visit: Payer: Self-pay | Admitting: Adult Health

## 2020-05-16 ENCOUNTER — Other Ambulatory Visit: Payer: Self-pay | Admitting: Adult Health

## 2020-06-13 ENCOUNTER — Other Ambulatory Visit: Payer: Self-pay | Admitting: Adult Health

## 2020-06-19 ENCOUNTER — Ambulatory Visit (INDEPENDENT_AMBULATORY_CARE_PROVIDER_SITE_OTHER): Payer: 59 | Admitting: Adult Health

## 2020-06-19 VITALS — Ht 65.0 in | Wt 104.0 lb

## 2020-06-19 DIAGNOSIS — G40909 Epilepsy, unspecified, not intractable, without status epilepticus: Secondary | ICD-10-CM

## 2020-06-19 NOTE — Progress Notes (Signed)
PATIENT: Kent Peterson DOB: [birth year]  REASON FOR VISIT: follow up HISTORY FROM: Mother  HISTORY OF PRESENT ILLNESS: Today 06/19/20:  Kent Peterson is a 30 year old male with a history of viral encephalitis with associated seizures.  He returns today for follow-up.  He is here with his mother.  He remains on carbamazepine and Lamictal 150 mg twice a day.  Denies any seizure events.  06/14/19: Kent Peterson is a 30 year old male with a history of viral encephalitis with associated seizures.  He returns today with his mother.  He remains on carbamazepine and Lamictal.  Mother denies any seizure events.  Reports that he tolerates the medication well.  Denies any new issues.  Returns today for follow-up.  HISTORY 03/23/18:  Kent Peterson is a 30 year old male with a history of viral encephalitis with associated seizures.  He returns today for follow-up.  He is currently on carbamazepine and Lamictal.  His mother reports that he has not had any seizure events.  He continues to tolerate the medication well.  The patient primarily uses a wheelchair when ambulating.  His mother states that at home he can stand with assistance.  She reports that he typically does not follow commands and is visually impaired.  He returns today for evaluation.  REVIEW OF SYSTEMS: Out of a complete 14 system review of symptoms, the patient complains only of the following symptoms, and all other reviewed systems are negative.  See HPI  ALLERGIES: Allergies  Allergen Reactions  . Latex Other (See Comments)    Not allergic    HOME MEDICATIONS: Outpatient Medications Prior to Visit  Medication Sig Dispense Refill  . carbamazepine (CARBATROL) 100 MG 12 hr capsule TAKE 1 CAPSULE BY MOUTH TWICE A DAY 180 capsule 0  . carbamazepine (CARBATROL) 200 MG 12 hr capsule Take 2 capsules (400 mg total) by mouth 2 (two) times daily. 360 capsule 3  . lamoTRIgine (LAMICTAL) 150 MG tablet TAKE 1 TABLET BY MOUTH TWICE A  DAY 180 tablet 0  . LORazepam (ATIVAN) 1 MG tablet TAKE 1 TABLET BY MOUTH AT BEDTIME AS NEEDED FOR ANXIETY 30 tablet 1  . magnesium gluconate (MAGONATE) 500 MG tablet Take 500 mg by mouth daily.    . risperiDONE (RISPERDAL) 2 MG tablet Take 2 mg by mouth 2 (two) times daily.     No facility-administered medications prior to visit.    PAST MEDICAL HISTORY: Past Medical History:  Diagnosis Date  . 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 Seizure disorder (HCC) 03/12/2017  . Seizures (HCC)     PAST SURGICAL HISTORY: Past Surgical History:  Procedure Laterality Date  . right leg surgery      FAMILY HISTORY: Family History  Problem Relation Age of Onset  . Diabetes Mother   . Hypercholesterolemia Mother   . Hypertension Father   . Healthy Brother     SOCIAL HISTORY: Social History   Socioeconomic History  . Marital status: Single    Spouse name: Not on file  . Number of children: 0  . Years of education: 57  . Highest education level: Not on file  Occupational History  . Not on file  Tobacco Use  . Smoking status: Never Smoker  . Smokeless tobacco: Never Used  Vaping Use  . Vaping Use: Never used  Substance and Sexual Activity  . Alcohol use: No  . Drug use: No  . Sexual activity: Not on file  Other Topics Concern  .  Not on file  Social History Narrative   Lives with parents   Caffeine use:    none   Social Determinants of Health   Financial Resource Strain: Not on file  Food Insecurity: Not on file  Transportation Needs: Not on file  Physical Activity: Not on file  Stress: Not on file  Social Connections: Not on file  Intimate Partner Violence: Not on file      PHYSICAL EXAM  Vitals:   06/19/20 0907  Weight: 104 lb (47.2 kg)  Height: 5\' 5"  (1.651 m)   Body mass index is 17.31 kg/m.  Generalized: Well developed, in no acute distress   Neurological examination  Mentation: Alert.  Does not  follow commands.  Nonverbal Cranial nerve II-XII: Pupils were equal round reactive to light. Extraocular movements were full, visual field were full on confrontational test.  Motor: Good strength in the upper extremities.  Unable to test in the lower but does stand from a seated position with assistance.  Sensory: Unable to test Coordination: Unable to test Gait and station: Patient's gait is unsteady.  Requires 1 person assist.  Walks with bent knees.   DIAGNOSTIC DATA (LABS, IMAGING, TESTING) - I reviewed patient records, labs, notes, testing and imaging myself where available.  Lab Results  Component Value Date   WBC 2.5 (LL) 06/14/2019   HGB 14.3 06/14/2019   HCT 41.5 06/14/2019   MCV 94 06/14/2019   PLT 179 06/14/2019      Component Value Date/Time   NA 144 06/14/2019 0927   K 3.9 06/14/2019 0927   CL 101 06/14/2019 0927   CO2 29 06/14/2019 0927   GLUCOSE 85 06/14/2019 0927   GLUCOSE 101 (H) 11/13/2015 1237   BUN 13 06/14/2019 0927   CREATININE 0.86 06/14/2019 0927   CALCIUM 9.5 06/14/2019 0927   PROT 6.8 06/14/2019 0927   ALBUMIN 4.8 06/14/2019 0927   AST 31 06/14/2019 0927   ALT 45 (H) 06/14/2019 0927   ALKPHOS 114 06/14/2019 0927   BILITOT 0.3 06/14/2019 0927   GFRNONAA 116 06/14/2019 0927   GFRAA 135 06/14/2019 0927      ASSESSMENT AND PLAN 30 y.o. year old male  has a past medical history of Chronic static encephalopathy (03/12/2017), Seizure disorder (HCC) (03/12/2017), and Seizures (HCC). here with:  1.  Seizures  -Continue carbamazepine 500 mg BID  -Continue Lamictal 150 mg twice a day  -Blood work today -Follow-up in 1 year or sooner if needed   I spent 20 minutes of face-to-face and non-face-to-face time with patient.  This included previsit chart review, lab review, study review, order entry, electronic health record documentation, patient education.  03/14/2017, MSN, NP-C 06/19/2020, 9:14 AM [facility] [address],  [address] [place], [name] [phone]

## 2020-06-19 NOTE — Progress Notes (Signed)
I have read the note, and I agree with the clinical assessment and plan.  Jiro Kiester K Latika Kronick   

## 2020-06-19 NOTE — Patient Instructions (Signed)
Continue Lamictal 150 mg twice a day Continue carbamazepine Blood work today If you have any seizure events please let us know.

## 2020-06-20 LAB — CBC WITH DIFFERENTIAL/PLATELET
Basophils Absolute: 0 10*3/uL (ref 0.0–0.2)
Basos: 1 %
EOS (ABSOLUTE): 1 10*3/uL — ABNORMAL HIGH (ref 0.0–0.4)
Eos: 22 %
Hematocrit: 32.4 % — ABNORMAL LOW (ref 37.5–51.0)
Hemoglobin: 10.9 g/dL — ABNORMAL LOW (ref 13.0–17.7)
Immature Grans (Abs): 0 10*3/uL (ref 0.0–0.1)
Immature Granulocytes: 0 %
Lymphocytes Absolute: 0.8 10*3/uL (ref 0.7–3.1)
Lymphs: 19 %
MCH: 29.1 pg (ref 26.6–33.0)
MCHC: 33.6 g/dL (ref 31.5–35.7)
MCV: 87 fL (ref 79–97)
Monocytes Absolute: 0.3 10*3/uL (ref 0.1–0.9)
Monocytes: 6 %
Neutrophils Absolute: 2.3 10*3/uL (ref 1.4–7.0)
Neutrophils: 52 %
Platelets: 234 10*3/uL (ref 150–450)
RBC: 3.74 x10E6/uL — ABNORMAL LOW (ref 4.14–5.80)
RDW: 13.2 % (ref 11.6–15.4)
WBC: 4.4 10*3/uL (ref 3.4–10.8)

## 2020-06-20 LAB — COMPREHENSIVE METABOLIC PANEL
ALT: 27 IU/L (ref 0–44)
AST: 26 IU/L (ref 0–40)
Albumin/Globulin Ratio: 2.3 — ABNORMAL HIGH (ref 1.2–2.2)
Albumin: 3.6 g/dL — ABNORMAL LOW (ref 4.0–5.0)
Alkaline Phosphatase: 82 IU/L (ref 44–121)
BUN/Creatinine Ratio: 22 — ABNORMAL HIGH (ref 9–20)
BUN: 17 mg/dL (ref 6–20)
Bilirubin Total: <0.2 mg/dL (ref 0.0–1.2)
CO2: 24 mmol/L (ref 20–29)
Calcium: 8.6 mg/dL — ABNORMAL LOW (ref 8.7–10.2)
Chloride: 107 mmol/L — ABNORMAL HIGH (ref 96–106)
Creatinine, Ser: 0.77 mg/dL (ref 0.76–1.27)
Globulin, Total: 1.6 g/dL (ref 1.5–4.5)
Glucose: 106 mg/dL — ABNORMAL HIGH (ref 65–99)
Potassium: 4.1 mmol/L (ref 3.5–5.2)
Sodium: 144 mmol/L (ref 134–144)
Total Protein: 5.2 g/dL — ABNORMAL LOW (ref 6.0–8.5)
eGFR: 123 mL/min/{1.73_m2} (ref >59)

## 2020-06-20 LAB — CARBAMAZEPINE LEVEL, TOTAL: Carbamazepine (Tegretol), S: 7.9 ug/mL (ref 4.0–12.0)

## 2020-06-20 LAB — LAMOTRIGINE LEVEL: Lamotrigine Lvl: 6.4 ug/mL (ref 2.0–20.0)

## 2020-06-22 ENCOUNTER — Telehealth: Payer: Self-pay

## 2020-06-22 ENCOUNTER — Telehealth: Payer: Self-pay | Admitting: *Deleted

## 2020-06-22 NOTE — Telephone Encounter (Signed)
LMVM for pt to return call monday for results.

## 2020-06-22 NOTE — Telephone Encounter (Signed)
Attempted to call pt, LVM for results per DPR. °Ask pt to call back for questions or concerns.  °

## 2020-06-22 NOTE — Telephone Encounter (Signed)
-----   Message from Megan Millikan, NP sent at 06/20/2020  3:54 PM EDT ----- Please send his blood work to primary care for review his total protein and albumin is slightly low.  RBC hemoglobin and hematocrit is also low.  Lamictal and carbamazepine levels are in normal range 

## 2020-06-22 NOTE — Telephone Encounter (Signed)
-----   Message from Butch Penny, NP sent at 06/20/2020  3:54 PM EDT ----- Please send his blood work to primary care for review his total protein and albumin is slightly low.  RBC hemoglobin and hematocrit is also low.  Lamictal and carbamazepine levels are in normal range

## 2020-09-09 ENCOUNTER — Other Ambulatory Visit: Payer: Self-pay | Admitting: Adult Health

## 2020-10-05 ENCOUNTER — Other Ambulatory Visit: Payer: Self-pay | Admitting: Adult Health

## 2020-10-06 ENCOUNTER — Telehealth: Payer: Self-pay | Admitting: Adult Health

## 2020-10-06 NOTE — Telephone Encounter (Signed)
Pt's mother called requesting refill for son. Mom says he is completely out. Pharmacy [facility] [phone].

## 2020-10-09 NOTE — Telephone Encounter (Signed)
Refill for Lamictal sent on 10/05/20

## 2020-10-11 ENCOUNTER — Other Ambulatory Visit: Payer: Self-pay | Admitting: Adult Health

## 2020-10-18 ENCOUNTER — Other Ambulatory Visit: Payer: Self-pay | Admitting: Adult Health

## 2020-11-02 ENCOUNTER — Other Ambulatory Visit: Payer: Self-pay | Admitting: Adult Health

## 2020-12-25 ENCOUNTER — Other Ambulatory Visit: Payer: Self-pay | Admitting: Adult Health

## 2020-12-25 ENCOUNTER — Telehealth: Payer: Self-pay | Admitting: Adult Health

## 2020-12-25 NOTE — Telephone Encounter (Signed)
Pt's mother called stating the pt has not slept well in days and they are needing a refill request for his LORazepam (ATIVAN) 1 MG tablet sent in to the [facility] on [address]

## 2020-12-25 NOTE — Telephone Encounter (Signed)
Called and spoke to mother of pt.  He does not take all the time, 1-2 month depending re: sleep.  I relayed that since last filled 2020, if taking for anxiety or having sleep issues these need to be address by pcp as Dr. Anne Hahn will be retiring end of this month and chronic refills for benzo's would not be filled here.  She was ok to ask pcp for this.  Appreciated call back.

## 2020-12-25 NOTE — Telephone Encounter (Signed)
Patient sleep issues need to be addressed. If it's d/t anxiety- best to follow-up with PCP. Please let them know that with Dr. Clarisa Kindred retirement we are not prescribing chronic benzodiazepines.

## 2021-02-16 ENCOUNTER — Other Ambulatory Visit: Payer: Self-pay | Admitting: Adult Health

## 2021-05-26 ENCOUNTER — Other Ambulatory Visit: Payer: Self-pay | Admitting: Adult Health

## 2021-06-19 ENCOUNTER — Ambulatory Visit (INDEPENDENT_AMBULATORY_CARE_PROVIDER_SITE_OTHER): Payer: 59 | Admitting: Adult Health

## 2021-06-19 ENCOUNTER — Encounter: Payer: Self-pay | Admitting: Adult Health

## 2021-06-19 VITALS — BP 107/81 | HR 70 | Wt 105.0 lb

## 2021-06-19 DIAGNOSIS — G40909 Epilepsy, unspecified, not intractable, without status epilepticus: Secondary | ICD-10-CM

## 2021-06-19 MED ORDER — CARBAMAZEPINE ER 100 MG PO CP12: 100.0000 mg | ORAL_CAPSULE | Freq: Two times a day (BID) | ORAL | 3 refills | Status: DC

## 2021-06-19 MED ORDER — CARBAMAZEPINE ER 200 MG PO CP12: 400.0000 mg | ORAL_CAPSULE | Freq: Two times a day (BID) | ORAL | 3 refills | Status: DC

## 2021-06-19 NOTE — Progress Notes (Signed)
? ? ?PATIENT: Kent Peterson ?DOB: 1991/02/11 ? ?REASON FOR VISIT: follow up ?HISTORY FROM: Mother ? ?HISTORY OF PRESENT ILLNESS: ?Today 06/19/21: ?Kent Peterson is a 31 year old male with a history of viral encephalitis with associated seizures.  He returns today for follow-up with his mother.  She reports no seizures. Mother reports that his Hgb has been low, protein low- reports that he will be seeing a gastroenterologist tomorrow. ? ?05/30/20: Kent Peterson is a 31 year old male with a history of viral encephalitis with associated seizures.  He returns today for follow-up.  He is here with his mother.  He remains on carbamazepine and Lamictal 150 mg twice a day.  Denies any seizure events. ? ?06/14/19: Kent Peterson is a 31 year old male with a history of viral encephalitis with associated seizures.  He returns today with his mother.  He remains on carbamazepine and Lamictal.  Mother denies any seizure events.  Reports that he tolerates the medication well.  Denies any new issues.  Returns today for follow-up. ? ?HISTORY 03/23/18: ?  ?Kent Peterson is a 31 year old male with a history of viral encephalitis with associated seizures.  He returns today for follow-up.  He is currently on carbamazepine and Lamictal.  His mother reports that he has not had any seizure events.  He continues to tolerate the medication well.  The patient primarily uses a wheelchair when ambulating.  His mother states that at home he can stand with assistance.  She reports that he typically does not follow commands and is visually impaired.  He returns today for evaluation. ? ?REVIEW OF SYSTEMS: Out of a complete 14 system review of symptoms, the patient complains only of the following symptoms, and all other reviewed systems are negative. ? ?See HPI ? ?ALLERGIES: ?Allergies  ?Allergen Reactions  ? Latex Other (See Comments)  ?  Not allergic  ? ? ?HOME MEDICATIONS: ?Outpatient Medications Prior to Visit  ?Medication Sig Dispense Refill   ? carbamazepine (CARBATROL) 100 MG 12 hr capsule TAKE 1 CAPSULE BY MOUTH TWICE A DAY 180 capsule 1  ? carbamazepine (CARBATROL) 200 MG 12 hr capsule TAKE 2 CAPSULES BY MOUTH TWICE A DAY 360 capsule 3  ? lamoTRIgine (LAMICTAL) 150 MG tablet TAKE 1 TABLET BY MOUTH TWICE A DAY 180 tablet 2  ? LORazepam (ATIVAN) 1 MG tablet TAKE 1 TABLET BY MOUTH AT BEDTIME AS NEEDED FOR ANXIETY 30 tablet 1  ? magnesium gluconate (MAGONATE) 500 MG tablet Take 500 mg by mouth daily.    ? pantoprazole (PROTONIX) 40 MG tablet Take 40 mg by mouth daily.    ? risperiDONE (RISPERDAL) 2 MG tablet Take 2 mg by mouth 2 (two) times daily.    ? ?No facility-administered medications prior to visit.  ? ? ?PAST MEDICAL HISTORY: ?Past Medical History:  ?Diagnosis Date  ? 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 Following viral encephalitis as an infant  ? Seizure disorder (HCC) 03/12/2017  ? Seizures (HCC)   ? ? ?PAST SURGICAL HISTORY: ?Past Surgical History:  ?Procedure Laterality Date  ? right leg surgery    ? ? ?FAMILY HISTORY: ?Family History  ?Problem Relation Age of Onset  ? Diabetes Mother   ? Hypercholesterolemia Mother   ? Hypertension Father   ? Healthy Brother   ? ? ?SOCIAL HISTORY: ?Social History  ? ?Socioeconomic History  ? Marital status: Single  ?  Spouse name: Not on file  ? Number of children: 0  ? Years of education: 92  ? Highest education level:  Not on file  ?Occupational History  ? Not on file  ?Tobacco Use  ? Smoking status: Never  ? Smokeless tobacco: Never  ?Vaping Use  ? Vaping Use: Never used  ?Substance and Sexual Activity  ? Alcohol use: No  ? Drug use: No  ? Sexual activity: Not on file  ?Other Topics Concern  ? Not on file  ?Social History Narrative  ? Lives with parents  ? Caffeine use:   ? none  ? ?Social Determinants of Health  ? ?Financial Resource Strain: Not on file  ?Food Insecurity: Not on file  ?Transportation Needs: Not on file  ?Physical Activity: Not on file  ?Stress: Not on file  ?Social Connections: Not  on file  ?Intimate Partner Violence: Not on file  ? ? ? ? ?PHYSICAL EXAM ? ?Vitals:  ? 06/19/21 0839  ?BP: 107/81  ?Pulse: 70  ?Weight: 105 lb (47.6 kg)  ? ? ?Body mass index is 17.47 kg/m?. ? ?Generalized: Well developed, in no acute distress  ? ?Neurological examination  ?Mentation: Alert.  Does not follow commands.  Nonverbal ?Cranial nerve II-XII: Pupils were equal round reactive to light.  ?Motor: Good strength in the upper extremities.   ?Sensory: Unable to test ?Coordination: Unable to test ?Gait and station: In a motorized wheelchair ? ? ?DIAGNOSTIC DATA (LABS, IMAGING, TESTING) ?- I reviewed patient records, labs, notes, testing and imaging myself where available. ? ?Lab Results  ?Component Value Date  ? WBC 4.4 06/19/2020  ? HGB 10.9 (L) 06/19/2020  ? HCT 32.4 (L) 06/19/2020  ? MCV 87 06/19/2020  ? PLT 234 06/19/2020  ? ?   ?Component Value Date/Time  ? NA 144 06/19/2020 0925  ? K 4.1 06/19/2020 0925  ? CL 107 (H) 06/19/2020 0925  ? CO2 24 06/19/2020 0925  ? GLUCOSE 106 (H) 06/19/2020 0925  ? GLUCOSE 101 (H) 11/13/2015 1237  ? BUN 17 06/19/2020 0925  ? CREATININE 0.77 06/19/2020 0925  ? CALCIUM 8.6 (L) 06/19/2020 0925  ? PROT 5.2 (L) 06/19/2020 0925  ? ALBUMIN 3.6 (L) 06/19/2020 0925  ? AST 26 06/19/2020 0925  ? ALT 27 06/19/2020 0925  ? ALKPHOS 82 06/19/2020 0925  ? BILITOT <0.2 06/19/2020 0925  ? GFRNONAA 116 06/14/2019 0927  ? GFRAA 135 06/14/2019 0927  ? ? ? ? ?ASSESSMENT AND PLAN ?31 y.o. year old male  has a past medical history of Chronic static encephalopathy (03/12/2017), Seizure disorder (HCC) (03/12/2017), and Seizures (HCC). here with: ? ?1.  Seizures ? ?-Continue carbamazepine 500 mg BID  ?-Continue Lamictal 150 mg twice a day  ?-Patient's mother states the patient will have blood work tomorrow with gastroenterologist-advised that she could ask that they recheck Lamictal and carbamazepine level this is so the patient does not have to have blood work twice. ?-Follow-up in 1 year or sooner if  needed ? ?Previous patient of Dr. Anne Hahn we will get the patient established with another primary neurologist in our office ? ?Butch Penny, MSN, NP-C 06/19/2021, 8:36 AM ?[facility] ?[address], [address] ?[place], [name] [address] ?[phone] ? ? ?

## 2021-06-19 NOTE — Patient Instructions (Addendum)
Your Plan: ? ?Continue carbamazepine and Lamictal  ?Can ask MD tomorrow about check Lamictal and carbamazepine level. ?If your symptoms worsen or you develop new symptoms please let us know.  ? ?Thank you for coming to see [place] at [facility]. I hope we have been able to provide you high quality care today. ? ?You may receive a patient satisfaction survey over the next few weeks. We would appreciate your feedback and comments so that we may continue to improve ourselves and the health of our patients. ? ?

## 2021-06-20 ENCOUNTER — Other Ambulatory Visit: Payer: Self-pay | Admitting: Gastroenterology

## 2021-07-02 ENCOUNTER — Other Ambulatory Visit: Payer: Self-pay | Admitting: Gastroenterology

## 2021-07-02 DIAGNOSIS — D509 Iron deficiency anemia, unspecified: Secondary | ICD-10-CM

## 2021-07-05 ENCOUNTER — Other Ambulatory Visit (HOSPITAL_COMMUNITY): Payer: Self-pay | Admitting: Gastroenterology

## 2021-07-05 DIAGNOSIS — D509 Iron deficiency anemia, unspecified: Secondary | ICD-10-CM

## 2021-07-11 ENCOUNTER — Other Ambulatory Visit (HOSPITAL_COMMUNITY): Payer: Self-pay | Admitting: Gastroenterology

## 2021-07-11 ENCOUNTER — Ambulatory Visit (HOSPITAL_COMMUNITY)
Admission: RE | Admit: 2021-07-11 | Discharge: 2021-07-11 | Disposition: A | Discharge status: Home / Self Care | Payer: 59 | Source: Ambulatory Visit | Attending: Gastroenterology | Admitting: Gastroenterology

## 2021-07-11 DIAGNOSIS — D509 Iron deficiency anemia, unspecified: Secondary | ICD-10-CM | POA: Diagnosis not present

## 2021-07-11 MED ORDER — BARIUM SULFATE 0.1 % PO SUSP
ORAL | Status: AC
  Filled 2021-07-11: qty 3

## 2021-07-11 MED ORDER — IOHEXOL 300 MG/ML  SOLN
80.0000 mL | Freq: Once | INTRAMUSCULAR | Status: AC | PRN
  Administered 2021-07-11: 80 mL via INTRAVENOUS

## 2021-09-26 ENCOUNTER — Ambulatory Visit (HOSPITAL_COMMUNITY): Admit: 2021-09-26 | Payer: Medicaid Other | Admitting: Gastroenterology

## 2021-09-26 ENCOUNTER — Encounter (HOSPITAL_COMMUNITY): Payer: Self-pay

## 2021-09-26 SURGERY — ESOPHAGOGASTRODUODENOSCOPY (EGD) WITH PROPOFOL
Anesthesia: Monitor Anesthesia Care

## 2021-11-06 ENCOUNTER — Telehealth: Payer: Self-pay | Admitting: Hematology and Oncology

## 2021-11-06 NOTE — Telephone Encounter (Signed)
Scheduled appt per 8/15 referral. Pt's mother is aware of appt date and time. Pt's mother is aware to arrive 15 mins prior to appt time and to bring and updated insurance card. Pt's mother is aware of appt location.

## 2021-11-12 ENCOUNTER — Telehealth: Payer: Self-pay | Admitting: Nurse Practitioner

## 2021-11-12 NOTE — Telephone Encounter (Signed)
R/s pt's new hem appt to a different time. Spoke to pt's mother who is aware of new appt time.

## 2021-11-15 ENCOUNTER — Encounter: Payer: Self-pay | Admitting: Nurse Practitioner

## 2021-11-15 ENCOUNTER — Inpatient Hospital Stay: Payer: 59

## 2021-11-15 ENCOUNTER — Encounter: Payer: 59 | Admitting: Hematology and Oncology

## 2021-11-15 ENCOUNTER — Inpatient Hospital Stay: Payer: 59 | Attending: Hematology and Oncology | Admitting: Nurse Practitioner

## 2021-11-15 ENCOUNTER — Other Ambulatory Visit: Payer: Self-pay

## 2021-11-15 ENCOUNTER — Other Ambulatory Visit: Payer: 59

## 2021-11-15 VITALS — BP 130/85 | HR 127

## 2021-11-15 DIAGNOSIS — Z993 Dependence on wheelchair: Secondary | ICD-10-CM | POA: Diagnosis not present

## 2021-11-15 DIAGNOSIS — D696 Thrombocytopenia, unspecified: Secondary | ICD-10-CM | POA: Insufficient documentation

## 2021-11-15 DIAGNOSIS — Z79899 Other long term (current) drug therapy: Secondary | ICD-10-CM | POA: Insufficient documentation

## 2021-11-15 DIAGNOSIS — D709 Neutropenia, unspecified: Secondary | ICD-10-CM

## 2021-11-15 DIAGNOSIS — D649 Anemia, unspecified: Secondary | ICD-10-CM | POA: Insufficient documentation

## 2021-11-15 DIAGNOSIS — G40909 Epilepsy, unspecified, not intractable, without status epilepticus: Secondary | ICD-10-CM | POA: Diagnosis not present

## 2021-11-15 DIAGNOSIS — D72819 Decreased white blood cell count, unspecified: Secondary | ICD-10-CM | POA: Diagnosis not present

## 2021-11-15 NOTE — Progress Notes (Signed)
Kent Peterson   [phone]) [phone] Fax:([phone]) [place] consult Note   Patient Care Team: Tisovec, Fransico Him, MD as PCP - General (Internal Medicine) Ward Givens, NP as Registered Nurse (Neurology) Truitt Merle, MD as Consulting Physician (Hematology) Alla Feeling, NP as Nurse Practitioner (Nurse Practitioner) Date of Service: 11/15/2021  CHIEF COMPLAINTS/PURPOSE OF CONSULTATION:  Leukopenia, referred by PCP Dr. Domenick Gong  HISTORY OF PRESENTING ILLNESS:  Kent Peterson 31 y.o. male with past medical history including viral encephalitis, encephalopathy, and seizure disorder since 02/2017 is here because of low white blood cell count.  CBC was normal on 04/21/2010 on preop lab for left hip surgery secondary to a fall by Dr. Rodell Perna on 04/22/2010.  He was then found to have low white blood cell 2.4 on 01/01/2011 prior to dental surgery.  Next CBC wo diff 11/13/2015 showed persistently low WBC 2.8 and again 03/12/2017 at 2.6.  No other cytopenias on those dates.  Leukopenia remained stable until white blood cell count dropped to 2.5 on 06/14/2019, normal differential.  WBC normalized on 06/19/2020 but he developed mild anemia with hemoglobin 10.9. Family had been giving him NSAIDs.  CBC showed WBC 3.57, Hgb 7.4, platelet 258, ANC 1.4.  He was seen by GI Dr. Paulita Fujita 05/2021, further work-up showed normal V61 224, folic acid 8.5, ferritin 24, serum iron 344 with 95% saturation and normal TIBC 361.  H. pylori immunoglobulins were normal.  EGD was deferred and he underwent CT abdomen pelvis on 07/11/2021 which was negative. He was started on oral iron and PPI. Most recent labs at PCP 10/29/21 show WBC 2.0 with ANC 1.0, hgb 14.2, plt 130.  He was referred to hematology for further eval and management.  Socially, he lives with his parents. He had viral encephalitis and seizures at 89 days old, he is total care. Eats orally and toilets, but does not follow commands and is  nonverbal. Denies alcohol, tobacco, or other drug use. Has not had cancer screenings. No exposures to hep B, C, or HIV. No h/o recurrent infections. No h/o abdominal surgeries, eats a normal diet.   Today, he presents with his parents, in a motorized wheelchair. They feel him to be in his normal state of good health. No recent fever, weight loss, or known night sweats. No signs of pain. No new cough or respiratory changes. No obvious bleeding. He is on oral iron and compliant with all meds.     MEDICAL HISTORY:  Past Medical History:  Diagnosis Date   Chronic static encephalopathy 03/12/2017   Following viral encephalitis as an infant   Seizure disorder ([place]) 03/12/2017   Seizures ([place])     SURGICAL HISTORY: Past Surgical History:  Procedure Laterality Date   right leg surgery      SOCIAL HISTORY: Social History   Socioeconomic History   Marital status: Single    Spouse name: Not on file   Number of children: 0   Years of education: 12   Highest education level: Not on file  Occupational History   Not on file  Tobacco Use   Smoking status: Never   Smokeless tobacco: Never  Vaping Use   Vaping Use: Never used  Substance and Sexual Activity   Alcohol use: No   Drug use: No   Sexual activity: Not on file  Other Topics Concern   Not on file  Social History Narrative   Lives with parents   Caffeine use:  none   Social Determinants of Radio broadcast assistant Strain: Not on file  Food Insecurity: Not on file  Transportation Needs: Not on file  Physical Activity: Not on file  Stress: Not on file  Social Connections: Not on file  Intimate Partner Violence: Not on file    FAMILY HISTORY: Family History  Problem Relation Age of Onset   Diabetes Mother    Hypercholesterolemia Mother    Hypertension Father    Healthy Brother     ALLERGIES:  is allergic to latex.  MEDICATIONS:  Current Outpatient Medications  Medication Sig Dispense Refill    carbamazepine (CARBATROL) 100 MG 12 hr capsule Take 1 capsule (100 mg total) by mouth 2 (two) times daily. 180 capsule 3   carbamazepine (CARBATROL) 200 MG 12 hr capsule Take 2 capsules (400 mg total) by mouth 2 (two) times daily. 360 capsule 3   docusate sodium (COLACE) 100 MG capsule Take 100 mg by mouth daily.     ferrous sulfate 325 (65 FE) MG tablet Take 325 mg by mouth daily with breakfast.     lamoTRIgine (LAMICTAL) 150 MG tablet TAKE 1 TABLET BY MOUTH TWICE A DAY 180 tablet 2   loratadine (CLARITIN) 10 MG tablet Take 10 mg by mouth daily as needed for allergies.     LORazepam (ATIVAN) 1 MG tablet TAKE 1 TABLET BY MOUTH AT BEDTIME AS NEEDED FOR ANXIETY 30 tablet 1   magnesium gluconate (MAGONATE) 500 MG tablet Take 500 mg by mouth daily.     pantoprazole (PROTONIX) 40 MG tablet Take 40 mg by mouth daily.     risperiDONE (RISPERDAL) 2 MG tablet Take 2 mg by mouth 2 (two) times daily.     No current facility-administered medications for this visit.    REVIEW OF SYSTEMS:   Constitutional: Denies fevers, chills or abnormal night sweats or unintentional weight loss  Eyes: Denies blurriness of vision, double vision or watery eyes Ears, nose, mouth, throat, and face: Denies epistaxis, mucositis or sore throat Respiratory: Denies cough, dyspnea or wheezes Cardiovascular: Denies palpitation, chest discomfort or lower extremity swelling Gastrointestinal:  Denies vomiting, constipation, diarrhea, pain, hematochezia, heartburn or change in bowel habits Skin: Denies abnormal skin rashes Lymphatics: Denies new lymphadenopathy or easy bruising Neurological:Denies numbness, tingling, new weaknesses, or recent seizures  Behavioral/Psych: Mood is stable, no new changes  All other systems were reviewed with the patient and are negative.  PHYSICAL EXAMINATION: ECOG PERFORMANCE STATUS: at baseline   Vitals:   11/15/21 1102  BP: 130/85  Pulse: (!) 127  SpO2: (!) 89%   There were no vitals filed  for this visit.  Parents consented to physical exam GENERAL:alert, no distress and comfortable SKIN: no rashes or ecchymosis  EYES: sclera clear NECK: without mass LYMPH:  no palpable cervical, supraclavicular, axillary, or inguinal lymphadenopathy  LUNGS: clear with normal breathing effort HEART: regular rate & rhythm, and no murmurs and no lower extremity edema ABDOMEN:abdomen soft, non-tender and normal bowel sounds. No hepatosplenomegaly or mass Musculoskeletal:no cyanosis of digits and no clubbing  PSYCH/NEURO: alert, nonverbal. At baseline   LABORATORY DATA:  I have reviewed the data as listed    Latest Ref Rng & Units 06/19/2020    9:25 AM 06/14/2019    9:27 AM 03/23/2018   10:05 AM  CBC  WBC 3.4 - 10.8 x10E3/uL 4.4  2.5  2.7   Hemoglobin 13.0 - 17.7 g/dL 10.9  14.3  14.0   Hematocrit 37.5 - 51.0 % 32.4  41.5  38.1   Platelets 150 - 450 x10E3/uL 234  179  192        Latest Ref Rng & Units 06/19/2020    9:25 AM 06/14/2019    9:27 AM 03/23/2018   10:05 AM  CMP  Glucose 65 - 99 mg/dL 106  85  101   BUN 6 - 20 mg/dL $Remove'17  13  12   'VzNHRBa$ Creatinine 0.76 - 1.27 mg/dL 0.77  0.86  0.77   Sodium 134 - 144 mmol/L 144  144  144   Potassium 3.5 - 5.2 mmol/L 4.1  3.9  3.8   Chloride 96 - 106 mmol/L 107  101  103   CO2 20 - 29 mmol/L $RemoveB'24  29  26   'dxcTNVSe$ Calcium 8.7 - 10.2 mg/dL 8.6  9.5  9.5   Total Protein 6.0 - 8.5 g/dL 5.2  6.8  6.7   Total Bilirubin 0.0 - 1.2 mg/dL <0.2  0.3  0.4   Alkaline Phos 44 - 121 IU/L 82  114  109   AST 0 - 40 IU/L $Remov'26  31  24   'AFxUmr$ ALT 0 - 44 IU/L 27  45  31      RADIOGRAPHIC STUDIES: I have personally reviewed the radiological images as listed and agreed with the findings in the report. No results found.  ASSESSMENT & PLAN: 31 yo male   1.Leukopenia and thrombocytopenia  -We reviewed his medical record in detail with the patient and family. He has had mild leukopenia with predominant neutrophilia since at least 2012, WBC 2 - 4.4 with lowest ANC 1.0 -Platelets  have been intermittently low in past few months, but mild, and no signs of bleeding -Recent folate and B12 levels were normal; IDA was treated  -No known exposures to hep B, C, or HIV -We reviewed his CT AP from 06/2021 which is negative for liver disease, splenomegaly, adenopathy, or occult malignancy.  -His mild neutropenia and thrombocytopenia are likely related to his anti-seizure medications, which we know can cause blood dyscrasias.  -Unlikely this is a primary bone marrow disorder given the indolent nature and that he is asymptomatic. We are not recommending a bone marrow biopsy  -We recommend taking pre-natal vitamin which includes folate, B12, and iron which are essential nutrients for red cell production -We reviewed his infection risk and encouraged to monitor for fever or signs of infection -I recommend to stay up to date on age-appropriate/annual vaccines -We do recommend to monitor CBC with diff 2-3 times per year -If he has progressive neutropenia or frequent infections, we can discuss starting white cell growth factor -F/up open, will see him back if he has progressive cytopenias or frequent infections, otherwise he can follow up with PCP, neuro, and his regular care team.  -Pt seen with Dr. Burr Medico   2.Iron deficiency anemia -Likely secondary to PPI use -He was referred to Dr. Paulita Fujita, EGD was deferred -CT AP showed no bleeding, negative -lowest Hgb 7.2, ferritin 24 -he takes oral iron and PPI -anemia resolved on last labs 10/2021, Hgb 14 now  3. Viral encephalitis and seizure disorder -49 days old -has been on anti-seizure meds and other stabilizers since then -continue per neuro -no recent seizures   PLAN: -Medical record reviewed -Start pre-natal vitamin  -Neutropenic and thrombocytopenic precautions; vaccines etc -Monitor CBC/diff 2-3 times per year -Return as needed, with progressive cytopenias or recurrent infections -F/up with PCP, neuro    All questions were  answered. The patient knows  to call the clinic with any problems, questions or concerns.      Alla Feeling, NP 11/16/21   Addendum I have seen the patient, examined him. I agree with the assessment and and plan and have edited the notes.   31 yo male with PMH of viral encephalitis, encephalopathy, and seizure disorder since 02/2017, was referred for mild leukopenia.  His white count has been in the range of 2-4.4, with normal ANC and ALC,  he has an mild intermittent anemia also.  This is likely related to his multiple seizure medications, no lab evidence of B12 or iron deficiency.  Autoimmune related leukopenia is also possible.  He has not had frequent infections.  Given the stable mild leukopenia over 10 years, age, my suspicion for primary bone marrow disease, such as MDS, is very low.  I do not think he is a bone marrow biopsy.  I recommend him to continue to monitor CBC 2-3 times a year.  I will see him as needed in the future.  Truitt Merle  11/15/2021

## 2021-11-16 ENCOUNTER — Encounter: Payer: Self-pay | Admitting: Nurse Practitioner

## 2021-12-07 ENCOUNTER — Other Ambulatory Visit: Payer: Self-pay | Admitting: Adult Health

## 2022-06-12 ENCOUNTER — Other Ambulatory Visit: Payer: Self-pay | Admitting: Adult Health

## 2022-06-24 ENCOUNTER — Ambulatory Visit (INDEPENDENT_AMBULATORY_CARE_PROVIDER_SITE_OTHER): Payer: 59 | Admitting: Neurology

## 2022-06-24 ENCOUNTER — Encounter: Payer: Self-pay | Admitting: Neurology

## 2022-06-24 VITALS — BP 116/73 | HR 67 | Ht 65.0 in

## 2022-06-24 DIAGNOSIS — Z5181 Encounter for therapeutic drug level monitoring: Secondary | ICD-10-CM

## 2022-06-24 DIAGNOSIS — G40909 Epilepsy, unspecified, not intractable, without status epilepticus: Secondary | ICD-10-CM | POA: Diagnosis not present

## 2022-06-24 NOTE — Progress Notes (Signed)
PATIENT: Kent Peterson DOB: 10/26/1990  REASON FOR VISIT: follow up HISTORY FROM: Mother  HISTORY OF PRESENT ILLNESS: Today 06/24/22: Patient presents today for follow-up, he is accompanied by both parents.  Last visit was a year ago.  Since then he has not had any seizure or seizure like activity.  He is compliant with his lamotrigine and carbamazepine.  They did follow-up with the oncologist, primary care doctor, and GI doctors, was found that he has iron deficiency anemia due to GI bleed, patient was put on PPIs and prenatal vitamins, parents do report that his lab values continue to improve.  He is doing much better.  They do not have any current complaints at the moment.   06/19/2021 MM:  Kent Peterson is a 32 year old male with a history of viral encephalitis with associated seizures.  He returns today for follow-up with his mother.  She reports no seizures. Mother reports that his Hgb has been low, protein low- reports that he will be seeing a gastroenterologist tomorrow.  05/30/20: Kent Peterson is a 32 year old male with a history of viral encephalitis with associated seizures.  He returns today for follow-up.  He is here with his mother.  He remains on carbamazepine and Lamictal 150 mg twice a day.  Denies any seizure events.  06/14/19: Kent Peterson is a 32 year old male with a history of viral encephalitis with associated seizures.  He returns today with his mother.  He remains on carbamazepine and Lamictal.  Mother denies any seizure events.  Reports that he tolerates the medication well.  Denies any new issues.  Returns today for follow-up.  HISTORY 03/23/18:   Kent Peterson is a 32 year old male with a history of viral encephalitis with associated seizures.  He returns today for follow-up.  He is currently on carbamazepine and Lamictal.  His mother reports that he has not had any seizure events.  He continues to tolerate the medication well.  The patient primarily uses a  wheelchair when ambulating.  His mother states that at home he can stand with assistance.  She reports that he typically does not follow commands and is visually impaired.  He returns today for evaluation.  REVIEW OF SYSTEMS: Out of a complete 14 system review of symptoms, the patient complains only of the following symptoms, and all other reviewed systems are negative.  See HPI  ALLERGIES: Allergies  Allergen Reactions   Latex Other (See Comments)    Not allergic    HOME MEDICATIONS: Outpatient Medications Prior to Visit  Medication Sig Dispense Refill   carbamazepine (CARBATROL) 100 MG 12 hr capsule TAKE 1 CAPSULE BY MOUTH TWICE A DAY 180 capsule 3   carbamazepine (CARBATROL) 200 MG 12 hr capsule Take 2 capsules (400 mg total) by mouth 2 (two) times daily. 360 capsule 3   docusate sodium (COLACE) 100 MG capsule Take 100 mg by mouth daily.     ferrous sulfate 325 (65 FE) MG tablet Take 325 mg by mouth daily with breakfast.     lamoTRIgine (LAMICTAL) 150 MG tablet TAKE 1 TABLET BY MOUTH TWICE A DAY 180 tablet 2   loratadine (CLARITIN) 10 MG tablet Take 10 mg by mouth daily as needed for allergies.     LORazepam (ATIVAN) 1 MG tablet TAKE 1 TABLET BY MOUTH AT BEDTIME AS NEEDED FOR ANXIETY 30 tablet 1   magnesium gluconate (MAGONATE) 500 MG tablet Take 500 mg by mouth daily.     pantoprazole (PROTONIX) 40 MG tablet Take 40  mg by mouth daily.     risperiDONE (RISPERDAL) 2 MG tablet Take 2 mg by mouth 2 (two) times daily.     No facility-administered medications prior to visit.    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   Following viral encephalitis as an infant   Seizure disorder 03/12/2017   Seizures     PAST SURGICAL HISTORY: Past Surgical History:  Procedure Laterality Date   right leg surgery      FAMILY HISTORY: Family History  Problem Relation Age of Onset   Diabetes Mother    Hypercholesterolemia  Mother    Hypertension Father    Healthy Brother     SOCIAL HISTORY: Social History   Socioeconomic History   Marital status: Single    Spouse name: Not on file   Number of children: 0   Years of education: 12   Highest education level: Not on file  Occupational History   Not on file  Tobacco Use   Smoking status: Never   Smokeless tobacco: Never  Vaping Use   Vaping Use: Never used  Substance and Sexual Activity   Alcohol use: No   Drug use: No   Sexual activity: Not on file  Other Topics Concern   Not on file  Social History Narrative   Lives with parents   Caffeine use:    none   Social Determinants of Health   Financial Resource Strain: Not on file  Food Insecurity: Not on file  Transportation Needs: Not on file  Physical Activity: Not on file  Stress: Not on file  Social Connections: Not on file  Intimate Partner Violence: Not on file      PHYSICAL EXAM  Vitals:   06/24/22 0917  BP: 116/73  Pulse: 67  Height: 5\' 5"  (1.651 m)    Body mass index is 17.47 kg/m.  Generalized: Well developed, in no acute distress   Neurological examination  Mentation: Alert.  Does not follow commands.  Nonverbal Cranial nerve II-XII: Pupils were equal round reactive to light.  Motor: Good strength in the upper extremities.   Sensory: Unable to test Coordination: Unable to test Gait and station: In a motorized wheelchair   DIAGNOSTIC DATA (LABS, IMAGING, TESTING) - I reviewed patient records, labs, notes, testing and imaging myself where available.  Lab Results  Component Value Date   WBC 4.4 06/19/2020   HGB 10.9 (L) 06/19/2020   HCT 32.4 (L) 06/19/2020   MCV 87 06/19/2020   PLT 234 06/19/2020      Component Value Date/Time   NA 144 06/19/2020 0925   K 4.1 06/19/2020 0925   CL 107 (H) 06/19/2020 0925   CO2 24 06/19/2020 0925   GLUCOSE 106 (H) 06/19/2020 0925   GLUCOSE 101 (H) 11/13/2015 1237   BUN 17 06/19/2020 0925   CREATININE 0.77 06/19/2020  0925   CALCIUM 8.6 (L) 06/19/2020 0925   PROT 5.2 (L) 06/19/2020 0925   ALBUMIN 3.6 (L) 06/19/2020 0925   AST 26 06/19/2020 0925   ALT 27 06/19/2020 0925   ALKPHOS 82 06/19/2020 0925   BILITOT <0.2 06/19/2020 0925   GFRNONAA 116 06/14/2019 0927   GFRAA 135 06/14/2019 0927      ASSESSMENT AND PLAN 32 y.o. year old male  has a past medical history of Chronic static encephalopathy (03/12/2017), Seizure disorder (03/12/2017), and Seizures. here with:   1. Seizure disorder   2. Therapeutic drug monitoring    -  Continue carbamazepine 500 mg BID  -Continue Lamictal 150 mg twice a day  -Will check blood work today including Vit D level.  -Follow-up in 1 year or sooner if needed    Alric Ran, MD 06/24/2022, 9:21 AM [facility] [address], [place], [place] [address] [phone]

## 2022-06-24 NOTE — Patient Instructions (Signed)
-  Continue carbamazepine 500 mg BID  -Continue Lamictal 150 mg twice a day  -Will check blood work today including Vit D level.  -Follow-up in 1 year or sooner if needed

## 2022-06-26 LAB — CARBAMAZEPINE LEVEL, TOTAL: Carbamazepine (Tegretol), S: 16.2 ug/mL (ref 4.0–12.0)

## 2022-06-26 LAB — BASIC METABOLIC PANEL
BUN/Creatinine Ratio: 24 — ABNORMAL HIGH (ref 9–20)
BUN: 20 mg/dL (ref 6–20)
CO2: 28 mmol/L (ref 20–29)
Calcium: 9.6 mg/dL (ref 8.7–10.2)
Chloride: 103 mmol/L (ref 96–106)
Creatinine, Ser: 0.84 mg/dL (ref 0.76–1.27)
Glucose: 111 mg/dL — ABNORMAL HIGH (ref 70–99)
Potassium: 3.7 mmol/L (ref 3.5–5.2)
Sodium: 145 mmol/L — ABNORMAL HIGH (ref 134–144)
eGFR: 118 mL/min/{1.73_m2} (ref >59)

## 2022-06-26 LAB — LAMOTRIGINE LEVEL: Lamotrigine Lvl: 7.8 ug/mL (ref 2.0–20.0)

## 2022-06-26 LAB — VITAMIN D 25 HYDROXY (VIT D DEFICIENCY, FRACTURES): Vit D, 25-Hydroxy: 85.8 ng/mL (ref 30.0–100.0)

## 2022-06-29 NOTE — Progress Notes (Signed)
Please call and advise the patient that the recent labs we checked show an elevated Carbamazepine level but this is consistent with the high dose of Carbamazepine that you are taking. No further action is required on these tests at this time. Please remind patient to keep any upcoming appointments or tests and to call us with any interim questions, concerns, problems or updates. Thanks,   Windell Norfolk, MD

## 2022-07-30 ENCOUNTER — Other Ambulatory Visit: Payer: Self-pay | Admitting: Adult Health

## 2022-12-17 ENCOUNTER — Other Ambulatory Visit: Payer: Self-pay | Admitting: Adult Health

## 2023-02-20 ENCOUNTER — Emergency Department (HOSPITAL_COMMUNITY): Payer: 59

## 2023-02-20 ENCOUNTER — Inpatient Hospital Stay (HOSPITAL_COMMUNITY)
Admission: EM | Admit: 2023-02-20 | Discharge: 2023-02-23 | DRG: 871 | Disposition: A | Discharge status: Home / Self Care | Payer: 59 | Attending: Family Medicine | Admitting: Family Medicine

## 2023-02-20 ENCOUNTER — Encounter (HOSPITAL_COMMUNITY): Payer: Self-pay

## 2023-02-20 DIAGNOSIS — Z8249 Family history of ischemic heart disease and other diseases of the circulatory system: Secondary | ICD-10-CM

## 2023-02-20 DIAGNOSIS — R680 Hypothermia, not associated with low environmental temperature: Secondary | ICD-10-CM | POA: Diagnosis present

## 2023-02-20 DIAGNOSIS — Z79899 Other long term (current) drug therapy: Secondary | ICD-10-CM

## 2023-02-20 DIAGNOSIS — Z681 Body mass index (BMI) 19 or less, adult: Secondary | ICD-10-CM

## 2023-02-20 DIAGNOSIS — G9341 Metabolic encephalopathy: Secondary | ICD-10-CM | POA: Diagnosis present

## 2023-02-20 DIAGNOSIS — D72819 Decreased white blood cell count, unspecified: Secondary | ICD-10-CM

## 2023-02-20 DIAGNOSIS — T68XXXA Hypothermia, initial encounter: Secondary | ICD-10-CM | POA: Diagnosis not present

## 2023-02-20 DIAGNOSIS — A419 Sepsis, unspecified organism: Secondary | ICD-10-CM | POA: Diagnosis not present

## 2023-02-20 DIAGNOSIS — G9349 Other encephalopathy: Secondary | ICD-10-CM | POA: Diagnosis present

## 2023-02-20 DIAGNOSIS — D61818 Other pancytopenia: Secondary | ICD-10-CM | POA: Diagnosis present

## 2023-02-20 DIAGNOSIS — R64 Cachexia: Secondary | ICD-10-CM | POA: Diagnosis present

## 2023-02-20 DIAGNOSIS — Z8661 Personal history of infections of the central nervous system: Secondary | ICD-10-CM

## 2023-02-20 DIAGNOSIS — R5383 Other fatigue: Secondary | ICD-10-CM | POA: Diagnosis present

## 2023-02-20 DIAGNOSIS — K5900 Constipation, unspecified: Secondary | ICD-10-CM | POA: Diagnosis present

## 2023-02-20 DIAGNOSIS — H547 Unspecified visual loss: Secondary | ICD-10-CM | POA: Diagnosis present

## 2023-02-20 DIAGNOSIS — R195 Other fecal abnormalities: Secondary | ICD-10-CM

## 2023-02-20 DIAGNOSIS — Z1152 Encounter for screening for COVID-19: Secondary | ICD-10-CM

## 2023-02-20 DIAGNOSIS — Z83438 Family history of other disorder of lipoprotein metabolism and other lipidemia: Secondary | ICD-10-CM

## 2023-02-20 DIAGNOSIS — G40909 Epilepsy, unspecified, not intractable, without status epilepticus: Secondary | ICD-10-CM

## 2023-02-20 DIAGNOSIS — Z993 Dependence on wheelchair: Secondary | ICD-10-CM

## 2023-02-20 DIAGNOSIS — R6520 Severe sepsis without septic shock: Secondary | ICD-10-CM | POA: Diagnosis present

## 2023-02-20 LAB — CBC
HCT: 36.9 % — ABNORMAL LOW (ref 39.0–52.0)
Hemoglobin: 12.7 g/dL — ABNORMAL LOW (ref 13.0–17.0)
MCH: 34.1 pg — ABNORMAL HIGH (ref 26.0–34.0)
MCHC: 34.4 g/dL (ref 30.0–36.0)
MCV: 99.2 fL (ref 80.0–100.0)
Platelets: 117 10*3/uL — ABNORMAL LOW (ref 150–400)
RBC: 3.72 MIL/uL — ABNORMAL LOW (ref 4.22–5.81)
RDW: 12.1 % (ref 11.5–15.5)
WBC: 2.3 10*3/uL — ABNORMAL LOW (ref 4.0–10.5)
nRBC: 0 % (ref 0.0–0.2)

## 2023-02-20 LAB — BASIC METABOLIC PANEL
Anion gap: 8 (ref 5–15)
BUN: 21 mg/dL — ABNORMAL HIGH (ref 6–20)
CO2: 34 mmol/L — ABNORMAL HIGH (ref 22–32)
Calcium: 9.8 mg/dL (ref 8.9–10.3)
Chloride: 103 mmol/L (ref 98–111)
Creatinine, Ser: 0.91 mg/dL (ref 0.61–1.24)
GFR, Estimated: >60 mL/min (ref >60)
Glucose, Bld: 95 mg/dL (ref 70–99)
Potassium: 4.1 mmol/L (ref 3.5–5.1)
Sodium: 145 mmol/L (ref 135–145)

## 2023-02-20 LAB — APTT: aPTT: 34 s (ref 24–36)

## 2023-02-20 LAB — CARBAMAZEPINE LEVEL, TOTAL: Carbamazepine Lvl: 18.9 ug/mL (ref 4.0–12.0)

## 2023-02-20 LAB — RESP PANEL BY RT-PCR (RSV, FLU A&B, COVID)  RVPGX2
Influenza A by PCR: NEGATIVE
Influenza B by PCR: NEGATIVE
Resp Syncytial Virus by PCR: NEGATIVE
SARS Coronavirus 2 by RT PCR: NEGATIVE

## 2023-02-20 LAB — HEPATIC FUNCTION PANEL
ALT: 57 U/L — ABNORMAL HIGH (ref 0–44)
AST: 41 U/L (ref 15–41)
Albumin: 4 g/dL (ref 3.5–5.0)
Alkaline Phosphatase: 112 U/L (ref 38–126)
Bilirubin, Direct: 0.1 mg/dL (ref 0.0–0.2)
Indirect Bilirubin: 0.6 mg/dL (ref 0.3–0.9)
Total Bilirubin: 0.7 mg/dL (ref <1.2)
Total Protein: 6.6 g/dL (ref 6.5–8.1)

## 2023-02-20 LAB — PROTIME-INR
INR: 0.9 (ref 0.8–1.2)
Prothrombin Time: 12.6 s (ref 11.4–15.2)

## 2023-02-20 LAB — CK: Total CK: 39 U/L — ABNORMAL LOW (ref 49–397)

## 2023-02-20 LAB — T4, FREE: Free T4: 0.58 ng/dL — ABNORMAL LOW (ref 0.61–1.12)

## 2023-02-20 LAB — LACTIC ACID, PLASMA: Lactic Acid, Venous: 0.7 mmol/L (ref 0.5–1.9)

## 2023-02-20 LAB — I-STAT CG4 LACTIC ACID, ED: Lactic Acid, Venous: 0.6 mmol/L (ref 0.5–1.9)

## 2023-02-20 LAB — POC OCCULT BLOOD, ED: Fecal Occult Bld: POSITIVE — AB

## 2023-02-20 LAB — TSH: TSH: 4.394 u[IU]/mL (ref 0.350–4.500)

## 2023-02-20 LAB — MAGNESIUM: Magnesium: 2.8 mg/dL — ABNORMAL HIGH (ref 1.7–2.4)

## 2023-02-20 LAB — CBG MONITORING, ED: Glucose-Capillary: 92 mg/dL (ref 70–99)

## 2023-02-20 MED ORDER — VANCOMYCIN HCL IN DEXTROSE 1-5 GM/200ML-% IV SOLN
1000.0000 mg | Freq: Once | INTRAVENOUS | Status: AC
  Administered 2023-02-20: 1000 mg via INTRAVENOUS
  Filled 2023-02-20: qty 200

## 2023-02-20 MED ORDER — METRONIDAZOLE 500 MG/100ML IV SOLN
500.0000 mg | Freq: Once | INTRAVENOUS | Status: AC
  Administered 2023-02-20: 500 mg via INTRAVENOUS
  Filled 2023-02-20: qty 100

## 2023-02-20 MED ORDER — LACTATED RINGERS IV SOLN: INTRAVENOUS | Status: AC

## 2023-02-20 MED ORDER — SODIUM CHLORIDE 0.9 % IV SOLN
2.0000 g | Freq: Once | INTRAVENOUS | Status: AC
  Administered 2023-02-20: 2 g via INTRAVENOUS
  Filled 2023-02-20: qty 12.5

## 2023-02-20 MED ORDER — LACTATED RINGERS IV BOLUS (SEPSIS)
500.0000 mL | Freq: Once | INTRAVENOUS | Status: AC
  Administered 2023-02-21: 500 mL via INTRAVENOUS

## 2023-02-20 MED ORDER — LACTATED RINGERS IV BOLUS (SEPSIS)
1000.0000 mL | Freq: Once | INTRAVENOUS | Status: AC
  Administered 2023-02-20: 1000 mL via INTRAVENOUS

## 2023-02-20 NOTE — ED Triage Notes (Signed)
Pt is coming from home with a significant history of static encephalopathy, but coming in for lethargy that he has had for 1 week as well as some potential constipation as reported by the family. He is non-verbal at baseline and is unable to assist with the triage process.

## 2023-02-20 NOTE — Sepsis Progress Note (Signed)
Elink monitoring for the code sepsis protocol.  

## 2023-02-20 NOTE — Progress Notes (Signed)
ED Pharmacy Antibiotic Sign Off An antibiotic consult was received from an ED provider for cefepime and vancomycin per pharmacy dosing for sepsis. A chart review was completed to assess appropriateness.  The following one time order(s) were placed per pharmacy consult:  cefepime 2000 mg x 1 dose vancomycin 1000 mg x 1 dose  Further antibiotic and/or antibiotic pharmacy consults should be ordered by the admitting provider if indicated.   Thank you for allowing pharmacy to be a part of this patient's care.   Delmar Landau, PharmD, BCPS 02/20/2023 8:39 PM ED Clinical Pharmacist -  [phone]

## 2023-02-21 ENCOUNTER — Other Ambulatory Visit: Payer: Self-pay

## 2023-02-21 ENCOUNTER — Inpatient Hospital Stay (HOSPITAL_COMMUNITY): Payer: 59

## 2023-02-21 DIAGNOSIS — K5900 Constipation, unspecified: Secondary | ICD-10-CM | POA: Diagnosis present

## 2023-02-21 DIAGNOSIS — Z681 Body mass index (BMI) 19 or less, adult: Secondary | ICD-10-CM | POA: Diagnosis not present

## 2023-02-21 DIAGNOSIS — R195 Other fecal abnormalities: Secondary | ICD-10-CM

## 2023-02-21 DIAGNOSIS — D649 Anemia, unspecified: Secondary | ICD-10-CM

## 2023-02-21 DIAGNOSIS — D72819 Decreased white blood cell count, unspecified: Secondary | ICD-10-CM

## 2023-02-21 DIAGNOSIS — H547 Unspecified visual loss: Secondary | ICD-10-CM | POA: Diagnosis present

## 2023-02-21 DIAGNOSIS — Z993 Dependence on wheelchair: Secondary | ICD-10-CM | POA: Diagnosis not present

## 2023-02-21 DIAGNOSIS — Z8661 Personal history of infections of the central nervous system: Secondary | ICD-10-CM | POA: Diagnosis not present

## 2023-02-21 DIAGNOSIS — Z8249 Family history of ischemic heart disease and other diseases of the circulatory system: Secondary | ICD-10-CM | POA: Diagnosis not present

## 2023-02-21 DIAGNOSIS — D61818 Other pancytopenia: Secondary | ICD-10-CM | POA: Diagnosis present

## 2023-02-21 DIAGNOSIS — R64 Cachexia: Secondary | ICD-10-CM | POA: Diagnosis present

## 2023-02-21 DIAGNOSIS — Z83438 Family history of other disorder of lipoprotein metabolism and other lipidemia: Secondary | ICD-10-CM | POA: Diagnosis not present

## 2023-02-21 DIAGNOSIS — R6520 Severe sepsis without septic shock: Secondary | ICD-10-CM

## 2023-02-21 DIAGNOSIS — T68XXXA Hypothermia, initial encounter: Secondary | ICD-10-CM

## 2023-02-21 DIAGNOSIS — G9341 Metabolic encephalopathy: Secondary | ICD-10-CM | POA: Diagnosis present

## 2023-02-21 DIAGNOSIS — A419 Sepsis, unspecified organism: Principal | ICD-10-CM

## 2023-02-21 DIAGNOSIS — Z1152 Encounter for screening for COVID-19: Secondary | ICD-10-CM | POA: Diagnosis not present

## 2023-02-21 DIAGNOSIS — G40909 Epilepsy, unspecified, not intractable, without status epilepticus: Secondary | ICD-10-CM | POA: Diagnosis present

## 2023-02-21 DIAGNOSIS — R5383 Other fatigue: Secondary | ICD-10-CM | POA: Diagnosis present

## 2023-02-21 DIAGNOSIS — Z79899 Other long term (current) drug therapy: Secondary | ICD-10-CM | POA: Diagnosis not present

## 2023-02-21 DIAGNOSIS — R680 Hypothermia, not associated with low environmental temperature: Secondary | ICD-10-CM | POA: Diagnosis present

## 2023-02-21 LAB — I-STAT ARTERIAL BLOOD GAS, ED
Acid-Base Excess: 10 mmol/L — ABNORMAL HIGH (ref 0.0–2.0)
Bicarbonate: 37 mmol/L — ABNORMAL HIGH (ref 20.0–28.0)
Calcium, Ion: 1.33 mmol/L (ref 1.15–1.40)
HCT: 29 % — ABNORMAL LOW (ref 39.0–52.0)
Hemoglobin: 9.9 g/dL — ABNORMAL LOW (ref 13.0–17.0)
O2 Saturation: 96 %
Potassium: 4 mmol/L (ref 3.5–5.1)
Sodium: 145 mmol/L (ref 135–145)
TCO2: 39 mmol/L — ABNORMAL HIGH (ref 22–32)
pCO2 arterial: 60.6 mm[Hg] — ABNORMAL HIGH (ref 32–48)
pH, Arterial: 7.394 (ref 7.35–7.45)
pO2, Arterial: 89 mm[Hg] (ref 83–108)

## 2023-02-21 LAB — URINALYSIS, ROUTINE W REFLEX MICROSCOPIC
Bilirubin Urine: NEGATIVE
Glucose, UA: NEGATIVE mg/dL
Ketones, ur: NEGATIVE mg/dL
Leukocytes,Ua: NEGATIVE
Nitrite: NEGATIVE
Protein, ur: NEGATIVE mg/dL
Specific Gravity, Urine: 1.013 (ref 1.005–1.030)
pH: 7 (ref 5.0–8.0)

## 2023-02-21 LAB — BASIC METABOLIC PANEL
Anion gap: 5 (ref 5–15)
BUN: 17 mg/dL (ref 6–20)
CO2: 33 mmol/L — ABNORMAL HIGH (ref 22–32)
Calcium: 8.9 mg/dL (ref 8.9–10.3)
Chloride: 106 mmol/L (ref 98–111)
Creatinine, Ser: 0.98 mg/dL (ref 0.61–1.24)
GFR, Estimated: >60 mL/min (ref >60)
Glucose, Bld: 70 mg/dL (ref 70–99)
Potassium: 4 mmol/L (ref 3.5–5.1)
Sodium: 144 mmol/L (ref 135–145)

## 2023-02-21 LAB — RETICULOCYTES
Immature Retic Fract: 2.2 % — ABNORMAL LOW (ref 2.3–15.9)
RBC.: 3.19 MIL/uL — ABNORMAL LOW (ref 4.22–5.81)
Retic Count, Absolute: 31.3 10*3/uL (ref 19.0–186.0)
Retic Ct Pct: 1 % (ref 0.4–3.1)

## 2023-02-21 LAB — IRON AND TIBC
Iron: 121 ug/dL (ref 45–182)
Saturation Ratios: 43 % — ABNORMAL HIGH (ref 17.9–39.5)
TIBC: 283 ug/dL (ref 250–450)
UIBC: 162 ug/dL

## 2023-02-21 LAB — VITAMIN B12: Vitamin B-12: 1222 pg/mL — ABNORMAL HIGH (ref 180–914)

## 2023-02-21 LAB — HEMOGLOBIN AND HEMATOCRIT, BLOOD
HCT: 31.9 % — ABNORMAL LOW (ref 39.0–52.0)
Hemoglobin: 11 g/dL — ABNORMAL LOW (ref 13.0–17.0)

## 2023-02-21 LAB — TYPE AND SCREEN
ABO/RH(D): A POS
Antibody Screen: NEGATIVE

## 2023-02-21 LAB — FOLATE: Folate: 25.9 ng/mL (ref >5.9)

## 2023-02-21 LAB — FERRITIN: Ferritin: 292 ng/mL (ref 24–336)

## 2023-02-21 LAB — MAGNESIUM: Magnesium: 2.5 mg/dL — ABNORMAL HIGH (ref 1.7–2.4)

## 2023-02-21 LAB — LACTIC ACID, PLASMA: Lactic Acid, Venous: 0.6 mmol/L (ref 0.5–1.9)

## 2023-02-21 MED ORDER — PANTOPRAZOLE SODIUM 40 MG IV SOLR
40.0000 mg | INTRAVENOUS | Status: AC
  Filled 2023-02-21: qty 10

## 2023-02-21 MED ORDER — PANTOPRAZOLE SODIUM 40 MG IV SOLR
40.0000 mg | Freq: Two times a day (BID) | INTRAVENOUS | Status: DC
  Administered 2023-02-21 – 2023-02-22 (×4): 40 mg via INTRAVENOUS
  Filled 2023-02-21 (×3): qty 10

## 2023-02-21 MED ORDER — LORAZEPAM 2 MG/ML IJ SOLN
2.0000 mg | INTRAMUSCULAR | Status: DC | PRN
Start: 2023-02-21 — End: 2023-02-23

## 2023-02-21 MED ORDER — ACETAMINOPHEN 325 MG PO TABS: 650.0000 mg | ORAL_TABLET | Freq: Four times a day (QID) | ORAL | Status: DC | PRN

## 2023-02-21 MED ORDER — SODIUM CHLORIDE 0.9 % IV SOLN
1.0000 g | INTRAVENOUS | Status: DC
  Administered 2023-02-21 – 2023-02-23 (×3): 1 g via INTRAVENOUS
  Filled 2023-02-21 (×3): qty 10

## 2023-02-21 MED ORDER — LORAZEPAM 1 MG PO TABS
1.0000 mg | ORAL_TABLET | Freq: Every evening | ORAL | Status: DC | PRN
  Administered 2023-02-22: 1 mg via ORAL
  Filled 2023-02-21: qty 1

## 2023-02-21 MED ORDER — ACETAMINOPHEN 650 MG RE SUPP: 650.0000 mg | Freq: Four times a day (QID) | RECTAL | Status: DC | PRN

## 2023-02-21 MED ORDER — IOHEXOL 350 MG/ML SOLN
80.0000 mL | Freq: Once | INTRAVENOUS | Status: AC | PRN
  Administered 2023-02-21: 80 mL via INTRAVENOUS

## 2023-02-21 MED ORDER — CARBAMAZEPINE ER 200 MG PO CP12: 400.0000 mg | ORAL_CAPSULE | ORAL | Status: DC

## 2023-02-21 MED ORDER — CARBAMAZEPINE ER 200 MG PO TB12
500.0000 mg | ORAL_TABLET | Freq: Two times a day (BID) | ORAL | Status: DC
  Administered 2023-02-21 – 2023-02-23 (×5): 500 mg via ORAL
  Filled 2023-02-21 (×6): qty 1

## 2023-02-21 MED ORDER — LAMOTRIGINE 150 MG PO TABS
150.0000 mg | ORAL_TABLET | Freq: Two times a day (BID) | ORAL | Status: DC
  Administered 2023-02-21 – 2023-02-23 (×5): 150 mg via ORAL
  Filled 2023-02-21 (×7): qty 1

## 2023-02-21 MED ORDER — CARBAMAZEPINE ER 100 MG PO CP12: 100.0000 mg | ORAL_CAPSULE | ORAL | Status: DC

## 2023-02-21 MED ORDER — RISPERIDONE 0.5 MG PO TABS
2.0000 mg | ORAL_TABLET | Freq: Two times a day (BID) | ORAL | Status: DC
  Administered 2023-02-21 – 2023-02-23 (×5): 2 mg via ORAL
  Filled 2023-02-21: qty 4
  Filled 2023-02-21: qty 1
  Filled 2023-02-21 (×3): qty 4
  Filled 2023-02-21: qty 1
  Filled 2023-02-21: qty 4

## 2023-02-21 NOTE — ED Provider Notes (Signed)
[place] EMERGENCY DEPARTMENT AT [facility] Provider Note   CSN: [id number] Arrival date & time: 02/20/23  1911     History Chief Complaint  Patient presents with   Fatigue    Kent Peterson is a 32 y.o. male with h/o seizure disorder, non verbal at baseline, CP, leukopenia, IDA,  presents to the ER for evaluation of fatigue, excessive sleeping over the past 5-6 days, worsening yesterday. He comes with his parents who provide the majority of the history given the patient's non verbal baseline. Mom and dad report that he has had decrease PO intake over the past week. Reports he has been constipated more over the past month after having a diarrhea day a month ago. No vomiting or hematuria. Reports decrease in urine output. Denies any fever or cough. Dad reports that he was feeling cold. Mom reports he has been sleeping 12+ hours a day which is not typical for him. Level 5 caveat 2/2 patient's non verbal baseline.   HPI     Home Medications Prior to Admission medications   Medication Sig Start Date End Date Taking? Authorizing Provider  carbamazepine (CARBATROL) 100 MG 12 hr capsule TAKE 1 CAPSULE BY MOUTH TWICE A DAY Patient taking differently: Take 100 mg by mouth See admin instructions. Take 500mg  (one 100mg  capsule and two 200mg  capsules) by mouth twice daily, morning and evening. 06/13/22  Yes Millikan, Megan, NP  carbamazepine (CARBATROL) 200 MG 12 hr capsule TAKE 2 CAPSULES BY MOUTH 2 TIMES DAILY. Patient taking differently: Take 400 mg by mouth See admin instructions. Take 500mg  (one 100mg  capsule and two 200mg  capsules) by mouth twice daily, morning and evening. 07/30/22  Yes Camara, Amalia Hailey, MD  ferrous sulfate 325 (65 FE) MG tablet Take 325 mg by mouth daily with breakfast.   Yes [provider]  lamoTRIgine (LAMICTAL) 150 MG tablet TAKE 1 TABLET BY MOUTH TWICE A DAY Patient taking differently: Take 150 mg by mouth 2 (two) times daily. 12/19/22  Yes  Butch Penny, NP  loratadine (CLARITIN) 10 MG tablet Take 10 mg by mouth daily as needed for allergies.   Yes [provider]  LORazepam (ATIVAN) 1 MG tablet TAKE 1 TABLET BY MOUTH AT BEDTIME AS NEEDED FOR ANXIETY Patient taking differently: Take 1 mg by mouth at bedtime as needed for anxiety. 11/23/18  Yes York Spaniel, MD  magnesium gluconate (MAGONATE) 500 MG tablet Take 500 mg by mouth daily.   Yes [provider]  mupirocin ointment (BACTROBAN) 2 % Apply 1 Application topically 3 (three) times daily.   Yes [provider]  pantoprazole (PROTONIX) 40 MG tablet Take 40 mg by mouth daily. 05/30/21  Yes [provider]  Prenatal Vit-Fe Fumarate-FA (PRENATAL PO) Take 1 tablet by mouth daily. Unknown brand/dosage   Yes [provider]  risperiDONE (RISPERDAL) 2 MG tablet Take 2 mg by mouth 2 (two) times daily.   Yes [provider]      Allergies    Latex    Review of Systems   Review of Systems  Unable to perform ROS: Patient nonverbal    Physical Exam Updated Vital Signs BP (!) 120/92   Pulse 75   Temp (!) 92.6 F (33.7 C)   Resp 12   Wt 44 kg   SpO2 99%   BMI 16.14 kg/m  Physical Exam Constitutional:      Appearance: He is not toxic-appearing.     Comments: Awake, eyes are open. Does not  appear in acute distress. Chronically ill appearing.    HENT:     Mouth/Throat:     Mouth: Mucous membranes are moist.  Eyes:     General: No scleral icterus.    Pupils: Pupils are equal, round, and reactive to light.  Cardiovascular:     Rate and Rhythm: Normal rate.  Pulmonary:     Effort: Pulmonary effort is normal. No respiratory distress.  Abdominal:     Palpations: Abdomen is soft.     Tenderness: There is no guarding or rebound.  Musculoskeletal:     Right lower leg: No edema.     Left lower leg: No edema.     Comments: Palpable DP and PT pulses bilaterally. Compartments soft.   Skin:    General: Skin is warm and  dry.  Neurological:     Mental Status: He is alert.     Comments: Moving all extremities, however are in contractures. Neuro exam limited to patient's nonverbal status and inability to follow commands at baseline.     ED Results / Procedures / Treatments   Labs (all labs ordered are listed, but only abnormal results are displayed) Labs Reviewed  BASIC METABOLIC PANEL - Abnormal; Notable for the following components:      Result Value   CO2 34 (*)    BUN 21 (*)    All other components within normal limits  CBC - Abnormal; Notable for the following components:   WBC 2.3 (*)    RBC 3.72 (*)    Hemoglobin 12.7 (*)    HCT 36.9 (*)    MCH 34.1 (*)    Platelets 117 (*)    All other components within normal limits  CK - Abnormal; Notable for the following components:   Total CK 39 (*)    All other components within normal limits  CARBAMAZEPINE LEVEL, TOTAL - Abnormal; Notable for the following components:   Carbamazepine Lvl 18.9 (*)    All other components within normal limits  MAGNESIUM - Abnormal; Notable for the following components:   Magnesium 2.8 (*)    All other components within normal limits  HEPATIC FUNCTION PANEL - Abnormal; Notable for the following components:   ALT 57 (*)    All other components within normal limits  T4, FREE - Abnormal; Notable for the following components:   Free T4 0.58 (*)    All other components within normal limits  POC OCCULT BLOOD, ED - Abnormal; Notable for the following components:   Fecal Occult Bld POSITIVE (*)    All other components within normal limits  RESP PANEL BY RT-PCR (RSV, FLU A&B, COVID)  RVPGX2  CULTURE, BLOOD (ROUTINE X 2)  CULTURE, BLOOD (ROUTINE X 2)  APTT  PROTIME-INR  LACTIC ACID, PLASMA  TSH  URINALYSIS, ROUTINE W REFLEX MICROSCOPIC  LACTIC ACID, PLASMA  LAMOTRIGINE LEVEL  CBG MONITORING, ED  I-STAT CG4 LACTIC ACID, ED  I-STAT ARTERIAL BLOOD GAS, ED  I-STAT CG4 LACTIC ACID, ED    EKG EKG  Interpretation Date/Time:  Thursday February 20 2023 20:41:53 EST Ventricular Rate:  47 PR Interval:  260 QRS Duration:  118 QT Interval:  503 QTC Calculation: 445 R Axis:   75  Text Interpretation: Sinus bradycardia Prolonged PR interval Nonspecific intraventricular conduction delay ST elev, probable normal early repol pattern Confirmed by Fulton Reek [phone]) on 02/20/2023 8:54:27 PM  Radiology CT Head Wo Contrast  Result Date: 02/21/2023 CLINICAL DATA:  Mental status change lethargy EXAM: CT HEAD WITHOUT  CONTRAST TECHNIQUE: Contiguous axial images were obtained from the base of the skull through the vertex without intravenous contrast. RADIATION DOSE REDUCTION: This exam was performed according to the departmental dose-optimization program which includes automated exposure control, adjustment of the mA and/or kV according to patient size and/or use of iterative reconstruction technique. COMPARISON:  None Available. FINDINGS: Brain: No acute territorial infarction, hemorrhage or intracranial mass. Multifocal encephalomalacia involving the frontal, parietal and occipital lobes. Ex vacuo enlargement of the lateral ventricles. Encephalomalacia within the ganglial capsular regions. No mass effect or midline shift Vascular: No hyperdense vessels.  No unexpected calcification Skull: Normal. Negative for fracture or focal lesion. Sinuses/Orbits: Mucosal thickening left maxillary sinus Other: None IMPRESSION: 1. No CT evidence for acute intracranial abnormality. 2. Multifocal encephalomalacia involving the bilateral cerebral hemispheres with ex vacuo enlargement of the lateral ventricles. Electronically Signed   By: Jasmine Pang M.D.   On: 02/21/2023 00:00   DG Chest Portable 1 View  Result Date: 02/20/2023 CLINICAL DATA:  hypothermia EXAM: PORTABLE CHEST 1 VIEW COMPARISON:  Chest x-ray 11/13/2015 FINDINGS: The heart and mediastinal contours are within normal limits. Low lung volumes no focal  consolidation. No pulmonary edema. No pleural effusion. No pneumothorax. No acute osseous abnormality. IMPRESSION: Low lung volumes with no active disease. Electronically Signed   By: Tish Frederickson M.D.   On: 02/20/2023 20:54    Procedures .Critical Care  Performed by: Achille Rich, PA-C Authorized by: Achille Rich, PA-C   Critical care provider statement:    Critical care time (minutes):  60   Critical care was necessary to treat or prevent imminent or life-threatening deterioration of the following conditions:  Sepsis (hypothermia)   Critical care was time spent personally by me on the following activities:  Development of treatment plan with patient or surrogate, discussions with consultants, evaluation of patient's response to treatment, examination of patient, ordering and review of laboratory studies, ordering and review of radiographic studies, ordering and performing treatments and interventions, pulse oximetry, re-evaluation of patient's condition, review of old charts and obtaining history from patient or surrogate   Care discussed with: admitting provider      Medications Ordered in ED Medications  lactated ringers infusion ( Intravenous New Bag/Given 02/20/23 2150)  lactated ringers bolus 1,000 mL (1,000 mLs Intravenous New Bag/Given 02/20/23 2103)    And  lactated ringers bolus 500 mL (has no administration in time range)  metroNIDAZOLE (FLAGYL) IVPB 500 mg (500 mg Intravenous New Bag/Given 02/20/23 2108)  ceFEPIme (MAXIPIME) 2 g in sodium chloride 0.9 % 100 mL IVPB (2 g Intravenous New Bag/Given 02/20/23 2117)  vancomycin (VANCOCIN) IVPB 1000 mg/200 mL premix (1,000 mg Intravenous New Bag/Given 02/20/23 2214)    ED Course/ Medical Decision Making/ A&P                               Medical Decision Making Amount and/or Complexity of Data Reviewed Labs: ordered. Radiology: ordered.  Risk Prescription drug management. Decision regarding hospitalization.   32 y.o.  male presents to the ER for evaluation of fatigue and patient was found to be hypothermic. Differential diagnosis includes but is not limited to environmental, metabolic, infectious, medication side effect, neurological, endocrine, trauma, cardiovascular. Vital signs mild bradycardia, and hypothermia at 91.60F rectally. Physical exam as noted above.   On previous chart evaluation, the patient sees Dr. Teresa Coombs with neurology. He has a Carbamazepine level drawn at that time and was elevated at  16.2 which they report was normal/expected because of the high dose he is on. From reading a neurology note as well. It does mention that he has had anemia from a GI bleed before, however his parents said it was iron deficiency. Hemoglobin improved from what it was two years prior.   Upon seeing the leukopenia however does appear to be slightly worse than his chronic state and hypothermia, he started on broad-spectrum antibiotics for presumed sepsis.  Fluids are given as well.  Patient was immediately placed on bear hugger after rectal temperature revealed hypothermia.  I independently reviewed and interpreted the patient's labs.  BMP shows mildly increased bicarb at 34, normal anion gap.  BUN 21.  No other electrolyte abnormalities.  CBC shows chronic leukopenia 2.3 otherwise appears to be slightly worse than usual.  New lower platelet count of 117.  Anemia appears to be improved from labs 2 years ago with a hemoglobin of 12.7 in comparison to 10.9.  Lactic acid within normal limits.  aPTT and PT/INR within normal limits.  CK at 39.  Negative COVID, flu, RSV.  Magnesium slowly elevated at 2.8.  Patient's free T4 mildly decreased at 0.58 however has a normal TSH.  Fecal occult positive.  Patient has an elevated carbamazepine level at 18.9.has been elevated previously in the past from any neurology notes which they expecting this given the increased level that he is on. Hepatic panel shows mildly elevated ALT at 57.  Lamotrigine level pending. Blood cultures pending. ABG and urinalysis pending.   CT shows : 1. No CT evidence for acute intracranial abnormality. 2. Multifocal encephalomalacia involving the bilateral cerebral hemispheres with ex vacuo enlargement of the lateral ventricles. Per radiologist's interpretation.   CXR shows Low lung volumes with no active disease. Per radiologist's interpretation.   Temperature with the bear hugger is slowly improving and is now at 93.26F. Parents report that he is more awake and closer to his baseline. Temp foley in place for management.   ABG shows slightly increase pco2, however, patient is satting 100% on room air and does not appear in any acute distress.  Urinalysis shows some blood present as well as rare bacteria but no leukocytes, nitrites, or white blood cells present.  Will obtain urine culture.  I have consulted neurology on this and spoke with Dr. Iver Nestle on the patient. She will evaluate at bedside. She reports that toxicity usually appears at the 40 micrograms/mL.  Given the patient's hypothermia, does not appear to be thyroid in relation given borderline normal results.  Could be potentially sepsis and broad-spectrum antibiotics were given.  Could also be neurologic condition such as a stroke given his significant neuro history.  Also could be some hypopituitary.  Ultimately, patient will need further workup while in the patient for this.  Patient's parents are amenable to admission. Abdomen is soft, hemeoccult is positive, but anemia improved from previous 2 years ago. +/- CT abd while inpatient.   Portions of this report may have been transcribed using voice recognition software. Every effort was made to ensure accuracy; however, inadvertent computerized transcription errors may be present.   I discussed this case with my attending physician who cosigned this note including patient's presenting symptoms, physical exam, and planned diagnostics and  interventions. Attending physician stated agreement with plan or made changes to plan which were implemented.   Attending physician assessed patient at bedside.  Final Clinical Impression(s) / ED Diagnoses Final diagnoses:  Hypothermia, initial encounter    Rx /  DC Orders ED Discharge Orders     None         Achille Rich, [place] 02/21/23 1610    Laurence Spates, MD 02/21/23 [phone]

## 2023-02-21 NOTE — Consult Note (Signed)
NEUROLOGY CONSULT NOTE   Date of service: February 21, 2023 Patient Name: Kent Peterson MRN:  [id number] DOB:  [birth year] Chief Complaint: "Lethargy" Requesting Provider: No att. providers found  History of Present Illness  [name] is a 32 y.o. 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  She notes that he has had some constipation for approximately 1 month.  He has had increasingly poor appetite over the past 1 week with gradually progressive generalized weakness.  Today he only ate breakfast and has not eaten subsequently.  He has not been able to stand to assist with transfers today either.  Therefore family came to the ED for evaluation  Spell #  - Semiology: Generalized tonic-clonic seizure - Prodome: No clear prodrome - Post-spell: Fatigue - Triggers/frequency: No seizures in many years  Prior antiseizure medications For many years, patient has been stable on a combination of  carbamazepine 500 twice daily and  lamotrigine 150 twice daily  Mom does not recall any other antiseizure medications being tried for him in the past  He does also take lorazepam as needed for sleep approximately once or twice a month.  No recent increase use of this medication.  No other changes in medication.  No clear localizing signs or symptoms of infection, including no vomiting, diarrhea  He was found to be hypothermic in the ED and therefore was treated via sepsis protocol with a dose of cefepime, vancomycin and metronidazole and placed on a Bair hugger  Mom at bedside reports improvement from him being approximately 10% of his normal baseline on arrival to now 70 to 80% of normal baseline at the time of my evaluation  He normally takes his antiseizure medications at  6 AM and 6 PM and did get his doses today before coming to the ED in the evening; level was checked at approximately 9 PM  Neurology was asked to weigh in due to his complex neurological history and elevated carbamazepine level    ROS  Unable to assess secondary to patient's mental status, obtained from family as able  Past History   Past Medical History:  Diagnosis Date   Chronic 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 He has never used smokeless tobacco. He reports that he does not drink alcohol and does not use drugs.  Allergies  Allergen Reactions   Latex Other (See Comments)    Not allergic    Medications   Current Facility-Administered Medications:    [COMPLETED] lactated ringers bolus 1,000 mL, 1,000 mL, Intravenous, Once, Last Rate: 2,000 mL/hr at 02/20/23 2103, 1,000 mL at 02/20/23 2103 **AND** lactated ringers bolus 500 mL, 500 mL, Intravenous, Once, Achille Rich, PA-C   lactated ringers infusion, , Intravenous, Continuous, Achille Rich, [place], Last Rate: 150 mL/hr at 02/20/23 2150, New Bag at 02/20/23 2150  Current Outpatient Medications:  carbamazepine (CARBATROL) 100 MG 12 hr capsule, TAKE 1 CAPSULE BY MOUTH TWICE A DAY (Patient taking differently: Take 100 mg by mouth See admin instructions. Take 500mg  (one 100mg  capsule and two 200mg  capsules) by mouth twice daily, morning and evening.), Disp: 180 capsule, Rfl: 3   carbamazepine (CARBATROL) 200 MG 12 hr capsule, TAKE 2 CAPSULES BY MOUTH 2 TIMES DAILY. (Patient taking differently: Take 400 mg by mouth See admin instructions. Take  500mg  (one 100mg  capsule and two 200mg  capsules) by mouth twice daily, morning and evening.), Disp: 360 capsule, Rfl: 3   ferrous sulfate 325 (65 FE) MG tablet, Take 325 mg by mouth daily with breakfast., Disp: , Rfl:    lamoTRIgine (LAMICTAL) 150 MG tablet, TAKE 1 TABLET BY MOUTH TWICE A DAY (Patient taking differently: Take 150 mg by mouth 2 (two) times daily.), Disp: 180 tablet, Rfl: 1   loratadine (CLARITIN) 10 MG tablet, Take 10 mg by mouth daily as needed for allergies., Disp: , Rfl:    LORazepam (ATIVAN) 1 MG tablet, TAKE 1 TABLET BY MOUTH AT BEDTIME AS NEEDED FOR ANXIETY (Patient taking differently: Take 1 mg by mouth at bedtime as needed for anxiety.), Disp: 30 tablet, Rfl: 1   magnesium gluconate (MAGONATE) 500 MG tablet, Take 500 mg by mouth daily., Disp: , Rfl:    mupirocin ointment (BACTROBAN) 2 %, Apply 1 Application topically 3 (three) times daily., Disp: , Rfl:    pantoprazole (PROTONIX) 40 MG tablet, Take 40 mg by mouth daily., Disp: , Rfl:    Prenatal Vit-Fe Fumarate-FA (PRENATAL PO), Take 1 tablet by mouth daily. Unknown brand/dosage, Disp: , Rfl:    risperiDONE (RISPERDAL) 2 MG tablet, Take 2 mg by mouth 2 (two) times daily., Disp: , Rfl:   Vitals   Vitals:   02-22-23 2330 02/21/23 0000 02/21/23 0015 02/21/23 0030  BP: 115/80 111/81 109/84 99/70  Pulse: 75 62 68 62  Resp: 12 12 13    Temp: (!) 92.6 F (33.7 C) (!) 93.4 F (34.1 C) (!) 93.7 F (34.3 C) (!) 94.2 F (34.6 C)  TempSrc:      SpO2: 99% 100% 100% 100%  Weight:        Body mass index is 16.14 kg/m.  Physical Exam   Constitutional: Appears chronically ill Psych: Affect calm Eyes: No scleral injection.  HENT: No OP obstruction.  Head: Normocephalic.  Cardiovascular: Normal rate and regular rhythm.  Respiratory: Effort normal, non-labored breathing GI: Soft.  No distension. There is no tenderness.  Skin: WDI.   Neurologic Examination   Neurological examination  Mentation: Alert, orients to  examiner, does not follow commands, nonverbal but vocalizes with small trilling sounds at times Cranial nerves: Pupils equal round reactive to light, mildly disconjugate gaze at times, no reliable blink to threat, intact blink to light eyelash brush, face grossly symmetric Motor: Reactive to light stimulation in all 4 extremities.  Moves bilateral upper extremities slightly antigravity.  Withdraws to tickle in bilateral feet Coordination: Unable to test Gait and station: In a motorized wheelchair at baseline, deferred examination for safety  Labs/Imaging/Neurodiagnostic studies   CBC:  Recent Labs  Lab 02/22/2023 1935 02/21/23 0028  WBC 2.3*  --   HGB 12.7* 9.9*  HCT 36.9* 29.0*  MCV 99.2  --   PLT 117*  --    Basic Metabolic Panel:  Lab Results  Component Value Date   NA 145 02/21/2023   K 4.0 02/21/2023   CO2 34 (H) February 22, 2023   GLUCOSE 95  02/20/2023   BUN 21 (H) 02/20/2023   CREATININE 0.91 02/20/2023   CALCIUM 9.8 02/20/2023   GFRNONAA >60 02/20/2023   GFRAA 135 06/14/2019    INR  Lab Results  Component Value Date   INR 0.9 02/20/2023   APTT  Lab Results  Component Value Date   APTT 34 02/20/2023   AED levels:   Latest Reference Range & Units 04/21/10 10:04 03/12/17 08:57 03/23/18 10:05 06/14/19 09:27 06/19/20 09:25 06/24/22 10:04 02/20/23 20:49  Carbamazepine (Tegretol), S 4.0 - 12.0 ug/mL 11.6 11.2 8.4 10.7 7.9 16.2 (HH) 18.9 (HH)  Lamotrigine, Serum 2.0 - 20.0 ug/mL  3.7 3.7 5.8 6.4 7.8   (HH): Data is critically high  CT Head without contrast(Personally reviewed): 1. No CT evidence for acute intracranial abnormality. 2. Multifocal encephalomalacia involving the bilateral cerebral hemispheres with ex vacuo enlargement of the lateral ventricles.  ASSESSMENT   ZYIER VIATOR is a 31 y.o. male with past medical history significant for viral encephalitis at age 20 days complicated by visual impairment, static encephalopathy and epilepsy, presenting with  lethargy found to be hypothermic with slight worsening of his baseline leukopenia and slight thrombocytopenia.  He is rapidly improved with supportive care by ED team, and is approaching his baseline rapidly  Given carbamazepine was given 3 hours before level was drawn on the high level is closer to peak level than trough level.  It is also similar to his level in April which was again collected with similar timing from his last dose as confirmed by mom  RECOMMENDATIONS  -Continue carbamazepine 500 twice daily -Continue lamotrigine 150 twice daily -No need for EEG if patient continues to improve towards baseline  -Appreciate medical workup per ED and primary team -Seizure precautions,  -Ativan 2 mg as needed for seizure activity greater than 2 minutes, every 5 min for up to x 2 doses, please notify neurology if Ativan is used for consideration of dose adjustment of his standing antiseizure medication regimen -Cannot obtain MRI due to lack of patient ability to cooperate with scan, and given rapid improvement this study is unlikely to be revealing -Please avoid further doses of cefepime given this does lower the seizure threshold, appreciated alternative antibiotic if possible per ED or primary team -Inpatient neurology will sign off at this time, but do not hesitate to reach out if questions or concerns arise ______________________________________________________________________  Signed, Gordy Councilman, MD [facility]

## 2023-02-21 NOTE — Progress Notes (Addendum)
Patient seen and examined.  Mother and father were at the bedside.  Patient is making some noise and more alert..  Parents were happy that he is getting back to himself and mostly improved now.  Temperature has improved.  He was given a trial of [place] fries and he was able to swallow as he does at home. In brief, 32 year old with history of viral encephalitis and chronic encephalopathy seizures, baseline nonverbal, wheelchair-bound and total care at home brought to the hospital with him not acting right, sleeping a lot, was not wanting to get up at all.  In the emergency room temperature was 91.4, WBC count was 2.3.  Blood pressure was stable.  Treated as sepsis, reheated with Bair hugger and is stabilized.  Neurology consulted.  CT scan of the brain without any acute abnormalities.   Hypothermia and leukopenia, severe sepsis present on admission.  Primary cause unknown.  Clinically stabilizing.  Remains on ceftriaxone and vancomycin, maintenance IV fluids until final cultures are available.  Viral panel was negative. Allow regular diet.   Total time spent: 35 minutes.  Same-day admit.  No charge visit.  Addendum, diagnosis clarification.  This patient had a sepsis, no evidence of severe sepsis.

## 2023-02-21 NOTE — ED Notes (Signed)
ED TO INPATIENT HANDOFF REPORT  ED Nurse Name and Phone #: Irving Burton RN [phone]  S Name/Age/Gender Kent Peterson 32 y.o. male Room/Bed: 003C/003C  Code Status   Code Status: Full Code  Home/SNF/Other Home Patient oriented to: self Is this baseline? Yes   Triage Complete: Triage complete  Chief Complaint Hypothermia [T68.XXXA]  Triage Note Pt is coming from home with a significant history of static encephalopathy, but coming in for lethargy that he has had for 1 week as well as some potential constipation as reported by the family. He is non-verbal at baseline and is unable to assist with the triage process.    Allergies Allergies  Allergen Reactions   Latex Other (See Comments)    Not allergic    Level of Care/Admitting Diagnosis ED Disposition     ED Disposition  Admit   Condition  --   Comment  H[facility] [[id number]]  Level of Care: Telemetry Medical [104]  May admit patient to [facility] if equivalent level of care is available:: Yes  Covid Evaluation: Confirmed COVID Negative  Diagnosis: Hypothermia [[id number]]  Admitting Physician: Gery Pray [[id number]]  Attending Physician: Gery Pray [[id number]]  Certification:: I certify this patient will need inpatient services for at least 2 midnights  Expected Medical Readiness: 02/24/2023          B Medical/Surgery History Past Medical History:  Diagnosis Date   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 02/20/23  2244  Temperature probe  1            Intake/Output Last 24 hours  Intake/Output Summary (Last 24 hours) at 02/21/2023 1144 Last data filed at 02/21/2023 1016 Gross per 24 hour  Intake 1902.26 ml  Output --  Net 1902.26 ml    Labs/Imaging Results for orders placed or performed during the hospital encounter of 02/20/23 (from the past 48 hour(s))  Urinalysis, Routine w reflex microscopic -Urine, Clean Catch     Status: Abnormal   Collection Time: 02/20/23 12:00 AM  Result Value Ref Range   Color, Urine YELLOW YELLOW   APPearance CLOUDY (A) CLEAR   Specific Gravity, Urine 1.013 1.005 - 1.030   pH 7.0 5.0 - 8.0   Glucose, UA NEGATIVE NEGATIVE mg/dL   Hgb urine dipstick SMALL (A) NEGATIVE   Bilirubin Urine NEGATIVE NEGATIVE   Ketones, ur NEGATIVE NEGATIVE mg/dL   Protein, ur NEGATIVE NEGATIVE mg/dL   Nitrite NEGATIVE NEGATIVE   Leukocytes,Ua NEGATIVE NEGATIVE   RBC / HPF 6-10 0 - 5 RBC/hpf   WBC, UA 0-5 0 - 5 WBC/hpf   Bacteria, UA RARE (A) NONE SEEN   Squamous Epithelial / HPF 0-5 0 - 5 /HPF    Comment: Performed at [facility], [address], [place], [name] [address]  Basic metabolic panel     Status: Abnormal   Collection Time:  02/20/23  7:35 PM  Result Value Ref Range   Sodium 145 135 - 145 mmol/L   Potassium 4.1 3.5 - 5.1 mmol/L   Chloride 103 98 - 111 mmol/L   CO2 34 (H) 22 - 32 mmol/L   Glucose, Bld 95 70 - 99 mg/dL    Comment: Glucose reference range applies only to samples taken after fasting for at least 8 hours.   BUN 21 (H) 6 - 20 mg/dL   Creatinine, Ser 1.61 0.61 - 1.24 mg/dL   Calcium 9.8 8.9 - 09.6 mg/dL   GFR, Estimated >04 >54 mL/min    Comment: (NOTE) Calculated using the CKD-EPI Creatinine Equation (2021)    Anion gap 8 5 - 15    Comment: Performed at [facility], [address] [place], [name] [address]  CBC     Status: Abnormal   Collection  Time: 02/20/23  7:35 PM  Result Value Ref Range   WBC 2.3 (L) 4.0 - 10.5 K/uL   RBC 3.72 (L) 4.22 - 5.81 MIL/uL   Hemoglobin 12.7 (L) 13.0 - 17.0 g/dL   HCT 91.4 (L) 78.2 - 95.6 %   MCV 99.2 80.0 - 100.0 fL   MCH 34.1 (H) 26.0 - 34.0 pg   MCHC 34.4 30.0 - 36.0 g/dL   RDW 21.3 08.6 - 57.8 %   Platelets 117 (L) 150 - 400 K/uL    Comment: REPEATED TO VERIFY   nRBC 0.0 0.0 - 0.2 %    Comment: Performed at [facility], [address], [place], [name] [address]  CBG monitoring, ED     Status: None   Collection Time: 02/20/23  7:37 PM  Result Value Ref Range   Glucose-Capillary 92 70 - 99 mg/dL    Comment: Glucose reference range applies only to samples taken after fasting for at least 8 hours.  Resp panel by RT-PCR (RSV, Flu A&B, Covid) Anterior Nasal Swab     Status: None   Collection Time: 02/20/23  8:27 PM   Specimen: Anterior Nasal Swab  Result Value Ref Range   SARS Coronavirus 2 by RT PCR NEGATIVE NEGATIVE   Influenza A by PCR NEGATIVE NEGATIVE   Influenza B by PCR NEGATIVE NEGATIVE    Comment: (NOTE) The Xpert Xpress SARS-CoV-2/FLU/RSV plus assay is intended as an aid in the diagnosis of influenza from Nasopharyngeal swab specimens and should not be used as a sole basis for treatment. Nasal washings and aspirates are unacceptable for Xpert Xpress SARS-CoV-2/FLU/RSV testing.  Fact Sheet for Patients: BloggerCourse.com  Fact Sheet for Healthcare Providers: SeriousBroker.it  This test is not yet approved or cleared by the [place] FDA and has been authorized for detection and/or diagnosis of SARS-CoV-2 by FDA under an Emergency Use Authorization (EUA). This EUA will remain in effect (meaning this test can be used) for the duration of the COVID-19 declaration under Section 564(b)(1) of the Act, 21 U.S.C. section 360bbb-3(b)(1), unless the authorization is terminated or revoked.     Resp Syncytial Virus by PCR  NEGATIVE NEGATIVE    Comment: (NOTE) Fact Sheet for Patients: BloggerCourse.com  Fact Sheet for Healthcare Providers: SeriousBroker.it  This test is not yet approved or cleared by the [place] FDA and has been authorized for detection and/or diagnosis of SARS-CoV-2 by FDA under an Emergency Use Authorization (EUA). This EUA will remain in effect (meaning this test can be used) for the duration of the COVID-19 declaration under Section 564(b)(1) of the Act,  21 U.S.C. section 360bbb-3(b)(1), unless the authorization is terminated or revoked.  Performed at [facility], [address] [place], [name] [address]   Blood Culture (routine x 2)     Status: None (Preliminary result)   Collection Time: 02/20/23  8:35 PM   Specimen: BLOOD  Result Value Ref Range   Specimen Description BLOOD SITE NOT SPECIFIED    Special Requests      BOTTLES DRAWN AEROBIC AND ANAEROBIC Blood Culture results may not be optimal due to an inadequate volume of blood received in culture bottles   Culture      NO GROWTH < 12 HOURS Performed at [facility], [address], [place], [name] [address]    Report Status PENDING   APTT     Status: None   Collection Time: 02/20/23  8:49 PM  Result Value Ref Range   aPTT 34 24 - 36 seconds    Comment: Performed at [facility], [address], [place], [name] [address]  Protime-INR     Status: None   Collection Time: 02/20/23  8:49 PM  Result Value Ref Range   Prothrombin Time 12.6 11.4 - 15.2 seconds   INR 0.9 0.8 - 1.2    Comment: (NOTE) INR goal varies based on device and disease states. Performed at [facility], [address], [place], [name] [address]   Lactic acid, plasma     Status: None   Collection Time: 02/20/23  8:49 PM  Result Value Ref Range   Lactic Acid, Venous 0.7 0.5 - 1.9 mmol/L    Comment: Performed at [facility], [address] [place],  [name] [address]  CK     Status: Abnormal   Collection Time: 02/20/23  8:49 PM  Result Value Ref Range   Total CK 39 (L) 49 - 397 U/L    Comment: Performed at [facility], [address] [place], [name] [address]  Carbamazepine level, total     Status: Abnormal   Collection Time: 02/20/23  8:49 PM  Result Value Ref Range   Carbamazepine Lvl 18.9 (HH) 4.0 - 12.0 ug/mL    Comment: RESULT CONFIRMED BY MANUAL DILUTION CRITICAL RESULT CALLED TO, READ BACK BY AND VERIFIED WITH: Darcella Cheshire, RN 2240 02/20/2023 SANDOVAL K  Performed at [facility] Lab, [address], [place], [name] [address]   Magnesium     Status: Abnormal   Collection Time: 02/20/23  8:49 PM  Result Value Ref Range   Magnesium 2.8 (H) 1.7 - 2.4 mg/dL    Comment: Performed at [facility], [address] [place], [name] [address]  Hepatic function panel     Status: Abnormal   Collection Time: 02/20/23  8:49 PM  Result Value Ref Range   Total Protein 6.6 6.5 - 8.1 g/dL   Albumin 4.0 3.5 - 5.0 g/dL   AST 41 15 - 41 U/L   ALT 57 (H) 0 - 44 U/L   Alkaline Phosphatase 112 38 - 126 U/L   Total Bilirubin 0.7 <1.2 mg/dL   Bilirubin, Direct 0.1 0.0 - 0.2 mg/dL   Indirect Bilirubin 0.6 0.3 - 0.9 mg/dL    Comment: Performed at [facility], [address] [place], [name] [address]  TSH     Status: None   Collection Time: 02/20/23  8:49 PM  Result Value Ref Range   TSH 4.394 0.350 - 4.500 uIU/mL  Comment: Performed by a 3rd Generation assay with a functional sensitivity of <=0.01 uIU/mL. Performed at [facility], [address] [place], [name] [address]   T4, free     Status: Abnormal   Collection Time: 02/20/23  8:49 PM  Result Value Ref Range   Free T4 0.58 (L) 0.61 - 1.12 ng/dL    Comment: (NOTE) Biotin ingestion may interfere with free T4 tests. If the results are inconsistent with the TSH level, previous test results, or the clinical presentation, then consider biotin interference. If  needed, order repeat testing after stopping biotin. Performed at [facility], [address], [place], [name] [address]   I-Stat Lactic Acid, ED     Status: None   Collection Time: 02/20/23  9:10 PM  Result Value Ref Range   Lactic Acid, Venous 0.6 0.5 - 1.9 mmol/L  POC occult blood, ED     Status: Abnormal   Collection Time: 02/20/23  9:25 PM  Result Value Ref Range   Fecal Occult Bld POSITIVE (A) NEGATIVE  I-Stat arterial blood gas, ED ([facility] ED, [facility], [facility])     Status: Abnormal   Collection Time: 02/21/23 12:28 AM  Result Value Ref Range   pH, Arterial 7.394 7.35 - 7.45   pCO2 arterial 60.6 (H) 32 - 48 mmHg   pO2, Arterial 89 83 - 108 mmHg   Bicarbonate 37.0 (H) 20.0 - 28.0 mmol/L   TCO2 39 (H) 22 - 32 mmol/L   O2 Saturation 96 %   Acid-Base Excess 10.0 (H) 0.0 - 2.0 mmol/L   Sodium 145 135 - 145 mmol/L   Potassium 4.0 3.5 - 5.1 mmol/L   Calcium, Ion 1.33 1.15 - 1.40 mmol/L   HCT 29.0 (L) 39.0 - 52.0 %   Hemoglobin 9.9 (L) 13.0 - 17.0 g/dL   Collection site RADIAL, ALLEN'S TEST ACCEPTABLE    Drawn by RT    Sample type ARTERIAL   Lactic acid, plasma     Status: None   Collection Time: 02/21/23  4:17 AM  Result Value Ref Range   Lactic Acid, Venous 0.6 0.5 - 1.9 mmol/L    Comment: Performed at [facility], [address] [place], [name] [address]  Hemoglobin and hematocrit, blood     Status: Abnormal   Collection Time: 02/21/23  4:17 AM  Result Value Ref Range   Hemoglobin 11.0 (L) 13.0 - 17.0 g/dL   HCT 44.0 (L) 34.7 - 42.5 %    Comment: Performed at [facility], [address] [place], [name] [address]  Basic metabolic panel     Status: Abnormal   Collection Time: 02/21/23  4:17 AM  Result Value Ref Range   Sodium 144 135 - 145 mmol/L   Potassium 4.0 3.5 - 5.1 mmol/L   Chloride 106 98 - 111 mmol/L   CO2 33 (H) 22 - 32 mmol/L   Glucose, Bld 70 70 - 99 mg/dL    Comment: Glucose reference range applies only to samples taken after fasting for at  least 8 hours.   BUN 17 6 - 20 mg/dL   Creatinine, Ser 7.56 0.61 - 1.24 mg/dL   Calcium 8.9 8.9 - 43.3 mg/dL   GFR, Estimated >29 >51 mL/min    Comment: (NOTE) Calculated using the CKD-EPI Creatinine Equation (2021)    Anion gap 5 5 - 15    Comment: Performed at [facility], [address], [place], [name] [address]  Magnesium     Status: Abnormal   Collection Time: 02/21/23  4:17 AM  Result Value Ref Range   Magnesium 2.5 (H) 1.7 - 2.4 mg/dL    Comment: Performed at [facility], [address], [place], [name] [address]  Vitamin B12     Status: Abnormal   Collection Time: 02/21/23  4:17 AM  Result Value Ref Range   Vitamin B-12 1,222 (H) 180 - 914 pg/mL    Comment: (NOTE) This assay is not validated for testing neonatal or myeloproliferative syndrome specimens for Vitamin B12 levels. Performed at [facility], [address], [place], [name] [address]   Folate     Status: None   Collection Time: 02/21/23  4:17 AM  Result Value Ref Range   Folate 25.9 >5.9 ng/mL    Comment: Performed at [facility], [address] [place], [name] [address]  Iron and TIBC     Status: Abnormal   Collection Time: 02/21/23  4:17 AM  Result Value Ref Range   Iron 121 45 - 182 ug/dL   TIBC 573 220 - 254 ug/dL   Saturation Ratios 43 (H) 17.9 - 39.5 %   UIBC 162 ug/dL    Comment: Performed at [facility], [address], [place], [name] [address]  Ferritin     Status: None   Collection Time: 02/21/23  4:17 AM  Result Value Ref Range   Ferritin 292 24 - 336 ng/mL    Comment: Performed at [facility], [address], [place], [name] [address]  Reticulocytes     Status: Abnormal   Collection Time: 02/21/23  4:17 AM  Result Value Ref Range   Retic Ct Pct 1.0 0.4 - 3.1 %   RBC. 3.19 (L) 4.22 - 5.81 MIL/uL   Retic Count, Absolute 31.3 19.0 - 186.0 K/uL   Immature Retic Fract 2.2 (L) 2.3 - 15.9 %    Comment: Performed at [facility], [address], [place], [name] [address]  Type and screen [facility]     Status: None   Collection Time: 02/21/23  4:17 AM  Result Value Ref Range   ABO/RH(D) A POS    Antibody Screen NEG    Sample Expiration      02/24/2023,2359 Performed at [facility], [address], [place], [name] [address]    CT ANGIO GI BLEED  Result Date: 02/21/2023 CLINICAL DATA:  32 year old male with viral encephalitis and sequela of chronic encephalopathy and seizures. Parents report that patient has not been behaving at baseline for the past 5 days. Hypothermia. Decreasing hemoglobin. Fecal occult blood positive. EXAM: CTA ABDOMEN AND PELVIS WITHOUT AND WITH CONTRAST TECHNIQUE: Multidetector CT imaging of the abdomen and pelvis was performed using the standard protocol during bolus administration of intravenous contrast. Multiplanar reconstructed images and MIPs were obtained and reviewed to evaluate the vascular anatomy. RADIATION DOSE REDUCTION: This exam was performed according to the departmental dose-optimization program which includes automated exposure control, adjustment of the mA and/or kV according to patient size and/or use of iterative reconstruction technique. CONTRAST:  80mL OMNIPAQUE IOHEXOL 350 MG/ML SOLN COMPARISON:  Chest radiograph 02/20/2023; CT abdomen and pelvis 07/11/2021 FINDINGS: VASCULAR The aorta and its mesenteric, renal, and iliac artery branches without hemodynamically significant stenosis. No aortic aneurysm or dissection. Review of the MIP images confirms the above findings. NON-VASCULAR Lower chest: No acute abnormality. Hepatobiliary: Liver, gallbladder, and biliary  tree are unremarkable. Pancreas: Unremarkable. Spleen: Unremarkable. Adrenals/Urinary Tract: Normal kidneys and adrenal glands. Foley catheter and locules of gas in the bladder. Stomach/Bowel: Stomach is within normal limits. Large colonic stool burden no bowel wall thickening. No evidence of active GI  bleeding. The appendix is not definitively visualized. No secondary signs of appendicitis. Lymphatic: No lymphadenopathy. Reproductive: Unremarkable. Other: No free intraperitoneal fluid or air. Musculoskeletal: No acute fracture. Plate screw fixation left femur. IMPRESSION: 1. No evidence of active GI bleeding. 2. Large colonic stool burden. Electronically Signed   By: Minerva Fester M.D.   On: 02/21/2023 03:44   CT Head Wo Contrast  Result Date: 02/21/2023 CLINICAL DATA:  Mental status change lethargy EXAM: CT HEAD WITHOUT CONTRAST TECHNIQUE: Contiguous axial images were obtained from the base of the skull through the vertex without intravenous contrast. RADIATION DOSE REDUCTION: This exam was performed according to the departmental dose-optimization program which includes automated exposure control, adjustment of the mA and/or kV according to patient size and/or use of iterative reconstruction technique. COMPARISON:  None Available. FINDINGS: Brain: No acute territorial infarction, hemorrhage or intracranial mass. Multifocal encephalomalacia involving the frontal, parietal and occipital lobes. Ex vacuo enlargement of the lateral ventricles. Encephalomalacia within the ganglial capsular regions. No mass effect or midline shift Vascular: No hyperdense vessels.  No unexpected calcification Skull: Normal. Negative for fracture or focal lesion. Sinuses/Orbits: Mucosal thickening left maxillary sinus Other: None IMPRESSION: 1. No CT evidence for acute intracranial abnormality. 2. Multifocal encephalomalacia involving the bilateral cerebral hemispheres with ex vacuo enlargement of the lateral ventricles. Electronically Signed   By: Jasmine Pang M.D.   On: 02/21/2023 00:00   DG Chest Portable 1 View  Result Date: 02/20/2023 CLINICAL DATA:  hypothermia EXAM: PORTABLE CHEST 1 VIEW COMPARISON:  Chest x-ray 11/13/2015 FINDINGS: The heart and mediastinal contours are within normal limits. Low lung volumes no focal  consolidation. No pulmonary edema. No pleural effusion. No pneumothorax. No acute osseous abnormality. IMPRESSION: Low lung volumes with no active disease. Electronically Signed   By: Tish Frederickson M.D.   On: 02/20/2023 20:54    Pending Labs Unresulted Labs (From admission, onward)     Start     Ordered   02/22/23 0500  Magnesium  Tomorrow morning,   R        02/21/23 0811   02/22/23 0500  Phosphorus  Tomorrow morning,   R        02/21/23 0811   02/22/23 0500  CBC with Differential/Platelet  Tomorrow morning,   R        02/21/23 0811   02/22/23 0500  Comprehensive metabolic panel  Tomorrow morning,   R        02/21/23 0811   02/21/23 0059  Urine Culture  Add-on,   AD       Question:  Indication  Answer:  Sepsis   02/21/23 0058   02/20/23 2035  Blood Culture (routine x 2)  (Septic presentation on arrival (screening labs, nursing and treatment orders for obvious sepsis))  BLOOD CULTURE X 2,   STAT      02/20/23 2035   02/20/23 2023  Lamotrigine level  Once,   URGENT        02/20/23 2023            Vitals/Pain Today's Vitals   02/21/23 0915 02/21/23 1000 02/21/23 1015 02/21/23 1030  BP: 100/73 98/75 107/76   Pulse: 71 75 77 78  Resp: 14 16 13 12   Temp: 97.6  F (36.4 C) (!) 97.5 F (36.4 C) (!) 97.4 F (36.3 C) (!) 97.5 F (36.4 C)  TempSrc:      SpO2: 98% 100% 100% 99%  Weight:      Height:      PainSc:        Isolation Precautions No active isolations  Medications Medications  lactated ringers infusion ( Intravenous New Bag/Given 02/21/23 1124)  LORazepam (ATIVAN) injection 2 mg (has no administration in time range)  pantoprazole (PROTONIX) injection 40 mg (40 mg Intravenous Not Given 02/21/23 0710)    Followed by  pantoprazole (PROTONIX) injection 40 mg (40 mg Intravenous Given 02/21/23 0306)  acetaminophen (TYLENOL) tablet 650 mg (has no administration in time range)    Or  acetaminophen (TYLENOL) suppository 650 mg (has no administration in time range)   cefTRIAXone (ROCEPHIN) 1 g in sodium chloride 0.9 % 100 mL IVPB (1 g Intravenous New Bag/Given 02/21/23 1128)  LORazepam (ATIVAN) tablet 1 mg (has no administration in time range)  lamoTRIgine (LAMICTAL) tablet 150 mg (150 mg Oral Given 02/21/23 1129)  risperiDONE (RISPERDAL) tablet 2 mg (2 mg Oral Given 02/21/23 1129)  carbamazepine (TEGRETOL XR) 12 hr tablet 500 mg (500 mg Oral Given 02/21/23 1130)  lactated ringers bolus 1,000 mL (0 mLs Intravenous Stopped 02/21/23 0217)    And  lactated ringers bolus 500 mL (0 mLs Intravenous Stopped 02/21/23 0215)  metroNIDAZOLE (FLAGYL) IVPB 500 mg (0 mg Intravenous Stopped 02/20/23 2208)  ceFEPIme (MAXIPIME) 2 g in sodium chloride 0.9 % 100 mL IVPB (0 g Intravenous Stopped 02/21/23 0216)  vancomycin (VANCOCIN) IVPB 1000 mg/200 mL premix (0 mg Intravenous Stopped 02/21/23 0217)  iohexol (OMNIPAQUE) 350 MG/ML injection 80 mL (80 mLs Intravenous Contrast Given 02/21/23 0332)    Mobility power wheelchair     Focused Assessments Cardiac Assessment Handoff:  Cardiac Rhythm: Normal sinus rhythm Lab Results  Component Value Date   CKTOTAL 39 (L) 02/20/2023     , Neuro Assessment Handoff:   Cardiac Rhythm: Normal sinus rhythm       Neuro Assessment: Within Defined Limits (pt at his baseline)   , Pulmonary Assessment Handoff:  Lung sounds:   O2 Device: Room Air      R Recommendations: See Admitting Provider Note  Report given to:   Additional Notes: Pt at baseline w/ family at bedside. Family has been very helpful w/ pt care. Takes meds in applesauce, but prefers ice cream. Temp foley in place, no longer hypothermic.

## 2023-02-21 NOTE — ED Notes (Signed)
Stopped IV pump after antibiotics finished.  Pt's family stated that they did not need anything at this time. Pt was not in any obvious distress.

## 2023-02-21 NOTE — Plan of Care (Signed)
  Problem: Safety: Goal: Ability to remain free from injury will improve Outcome: Progressing   

## 2023-02-21 NOTE — ED Notes (Signed)
Patient was given a Turkey Sandwich bag. 

## 2023-02-21 NOTE — H&P (Addendum)
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  At baseline patient appears to understand but is nonverbal and unable to communicate in any way.  Per family he does respond meaningfully to pain.  Per parents the past 5 days the patient is just not been behaving at baseline.  He will get up to eat breakfast, then go right back to sleep.  Today he was not wanting to get up at all.  At baseline he does stand, allowing his mom to do his ADLs.  For the past 2 days he is really struggled with this.  He appears to be getting weaker.  He was chilly, temperature check at home on the blanket was 96 degrees.  The family is unable to assess worsening mentation, pain etc.  He was brought to the ER.  In ER patient was hypothermic at 91.4 degrees rectally.  Other vitals stable.  WBC 2.3, hemoglobin 12.7 => repeat 10.9, platelets 117.  Carbamazepine level elevated at 18.9, TSH normal 4.3, T4 mild decrease 0.58, respiratory panel normal.  Lactic acid normal.  Fecal occult blood positive [patient on iron].  Blood and urine culture collected pending.  Patient placed on a Lawyer.  Neurology consult requested.  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 Authorizing Provider  carbamazepine (CARBATROL) 100 MG 12 hr capsule TAKE 1 CAPSULE BY MOUTH TWICE A DAY Patient taking differently: Take 100 mg by mouth See admin instructions. Take 500mg  (one 100mg  capsule and two  200mg  capsules) by mouth twice daily, morning and evening. 06/13/22  Yes Millikan, Megan, NP  carbamazepine (CARBATROL) 200 MG 12 hr capsule TAKE 2 CAPSULES BY MOUTH 2 TIMES DAILY. Patient taking differently: Take 400 mg by mouth See admin instructions. Take 500mg  (one 100mg  capsule and two 200mg  capsules) by mouth twice daily, morning and evening. 07/30/22  Yes Camara, Amalia Hailey, MD  ferrous sulfate 325 (65 FE) MG tablet Take 325 mg by mouth daily with breakfast.   Yes [provider]  lamoTRIgine (LAMICTAL) 150 MG tablet TAKE 1 TABLET BY MOUTH TWICE A DAY Patient taking differently: Take 150 mg by mouth 2 (two) times daily. 12/19/22  Yes Butch Penny, NP  loratadine (CLARITIN) 10 MG tablet Take 10 mg by mouth daily as needed for allergies.   Yes [provider]  LORazepam (ATIVAN) 1 MG tablet TAKE 1 TABLET BY MOUTH AT BEDTIME AS NEEDED FOR ANXIETY Patient taking differently: Take 1 mg by mouth at bedtime as needed for anxiety. 11/23/18  Yes York Spaniel, MD  magnesium gluconate (MAGONATE) 500 MG tablet Take 500 mg by mouth daily.   Yes [provider]  mupirocin ointment (BACTROBAN) 2 % Apply 1 Application topically 3 (three) times daily.   Yes [provider]  pantoprazole (PROTONIX) 40 MG tablet Take 40 mg by mouth daily. 05/30/21  Yes [provider]  Prenatal Vit-Fe Fumarate-FA (PRENATAL PO) Take 1 tablet by mouth daily. Unknown brand/dosage  Yes [provider]  risperiDONE (RISPERDAL) 2 MG tablet Take 2 mg by mouth 2 (two) times daily.   Yes [provider]    Allergies:   Allergies  Allergen Reactions   Latex Other (See Comments)    Not allergic    Social History:  reports that he has never smoked. He has never used smokeless tobacco. He reports that he does not drink alcohol and does not use drugs.  Family History: Family History  Problem Relation Age of Onset   Diabetes Mother    Hypercholesterolemia Mother     Hypertension Father    Healthy Brother     Physical Exam: Vitals:   02/20/23 2330 02/21/23 0000 02/21/23 0015 02/21/23 0030  BP: 115/80 111/81 109/84 99/70  Pulse: 75 62 68 62  Resp: 12 12 13    Temp: (!) 92.6 F (33.7 C) (!) 93.4 F (34.1 C) (!) 93.7 F (34.3 C) (!) 94.2 F (34.6 C)  TempSrc:      SpO2: 99% 100% 100% 100%  Weight:        General: Awake, visibly tracks [baseline], cachectic, no acute distress Eyes: Pink conjunctiva, no scleral icterus ENT: Moist oral mucosa, neck supple, no thyromegaly Lungs: CTA B/L, no wheeze, no crackles, no use of accessory muscles Cardiovascular: RRR. No carotid bruits, no JVD Abdomen: soft, positive BS, NTND, no organomegaly, not an acute abdomen GU: not examined Neuro/Musculoskeletal: Unable to acutely assess due to patient's baseline mentation.  Bilateral foot drop Skin: no rash, no subcutaneous crepitation, no decubitus Psych: Unable to appropriately assess due to patient's baseline mentation  Labs on Admission:  Recent Labs    02/20/23 1935 02/20/23 2049 02/21/23 0028  NA 145  --  145  K 4.1  --  4.0  CL 103  --   --   CO2 34*  --   --   GLUCOSE 95  --   --   BUN 21*  --   --   CREATININE 0.91  --   --   CALCIUM 9.8  --   --   MG  --  2.8*  --    Recent Labs    02/20/23 2049  AST 41  ALT 57*  ALKPHOS 112  BILITOT 0.7  PROT 6.6  ALBUMIN 4.0    Recent Labs    02/20/23 1935 02/21/23 0028  WBC 2.3*  --   HGB 12.7* 9.9*  HCT 36.9* 29.0*  MCV 99.2  --   PLT 117*  --    Recent Labs    02/20/23 2049  CKTOTAL 39*    Recent Labs    02/20/23 2049  TSH 4.394    Micro Results: Recent Results (from the past 240 hour(s))  Resp panel by RT-PCR (RSV, Flu A&B, Covid) Anterior Nasal Swab     Status: None   Collection Time: 02/20/23  8:27 PM   Specimen: Anterior Nasal Swab  Result Value Ref Range Status   SARS Coronavirus 2 by RT PCR NEGATIVE NEGATIVE Final   Influenza A by PCR NEGATIVE NEGATIVE Final    Influenza B by PCR NEGATIVE NEGATIVE Final    Comment: (NOTE) The Xpert Xpress SARS-CoV-2/FLU/RSV plus assay is intended as an aid in the diagnosis of influenza from Nasopharyngeal swab specimens and should not be used as a sole basis for treatment. Nasal washings and aspirates are unacceptable for Xpert Xpress SARS-CoV-2/FLU/RSV testing.  Fact Sheet for Patients: BloggerCourse.com  Fact Sheet for Healthcare Providers: SeriousBroker.it  This test  is not yet approved or cleared by the [place] and has been authorized for detection and/or diagnosis of SARS-CoV-2 by FDA under an Emergency Use Authorization (EUA). This EUA will remain in effect (meaning this test can be used) for the duration of the COVID-19 declaration under Section 564(b)(1) of the Act, 21 U.S.C. section 360bbb-3(b)(1), unless the authorization is terminated or revoked.     Resp Syncytial Virus by PCR NEGATIVE NEGATIVE Final    Comment: (NOTE) Fact Sheet for Patients: BloggerCourse.com  Fact Sheet for Healthcare Providers: SeriousBroker.it  This test is not yet approved or cleared by the [place] FDA and has been authorized for detection and/or diagnosis of SARS-CoV-2 by FDA under an Emergency Use Authorization (EUA). This EUA will remain in effect (meaning this test can be used) for the duration of the COVID-19 declaration under Section 564(b)(1) of the Act, 21 U.S.C. section 360bbb-3(b)(1), unless the authorization is terminated or revoked.  Performed at [facility], [address], [place], [name] [address]      Radiological Exams on Admission: CT Head Wo Contrast  Result Date: 02/21/2023 CLINICAL DATA:  Mental status change lethargy EXAM: CT HEAD WITHOUT CONTRAST TECHNIQUE: Contiguous axial images were obtained from the base of the skull through the vertex without intravenous  contrast. RADIATION DOSE REDUCTION: This exam was performed according to the departmental dose-optimization program which includes automated exposure control, adjustment of the mA and/or kV according to patient size and/or use of iterative reconstruction technique. COMPARISON:  None Available. FINDINGS: Brain: No acute territorial infarction, hemorrhage or intracranial mass. Multifocal encephalomalacia involving the frontal, parietal and occipital lobes. Ex vacuo enlargement of the lateral ventricles. Encephalomalacia within the ganglial capsular regions. No mass effect or midline shift Vascular: No hyperdense vessels.  No unexpected calcification Skull: Normal. Negative for fracture or focal lesion. Sinuses/Orbits: Mucosal thickening left maxillary sinus Other: None IMPRESSION: 1. No CT evidence for acute intracranial abnormality. 2. Multifocal encephalomalacia involving the bilateral cerebral hemispheres with ex vacuo enlargement of the lateral ventricles. Electronically Signed   By: Jasmine Pang M.D.   On: 02/21/2023 00:00   DG Chest Portable 1 View  Result Date: 02/20/2023 CLINICAL DATA:  hypothermia EXAM: PORTABLE CHEST 1 VIEW COMPARISON:  Chest x-ray 11/13/2015 FINDINGS: The heart and mediastinal contours are within normal limits. Low lung volumes no focal consolidation. No pulmonary edema. No pleural effusion. No pneumothorax. No acute osseous abnormality. IMPRESSION: Low lung volumes with no active disease. Electronically Signed   By: Tish Frederickson M.D.   On: 02/20/2023 20:54    Assessment/Plan Present on Admission:  Hypothermia //  leukocytosis//  sluggish mentation -Given the above and patient's inability to communicate, antibiotics initiated.  Blood cultures x 2, urine cultures collected. -IV cefepime and vancomycin initiated in ER.  At neurologist recommendation, cefepime discontinued.  Rocephin continued. -Lawyer in place   Suspect GI bleed -N.p.o. [normal diet at baseline per  family] -Strict H&H every 8 hours, anemia panel ordered -IV Protonix and bolus -GI consult placed -CT abdomen pelvis ordered   Pancytopenia -No establish with recent baseline labs.  -CBC in AM. -Cultures collected.  Respiratory panel negative.    Chronic static encephalopathy -Holding Risperdal overnight.  Patient's a bit more alert, meaning tracks more spontaneously.   Seizure -Neurology consulted due to patient's elevated Tegretol level.  Question posed to the active cause hypothermia.  Per neurology elevated Tegretol level not high enough to create adverse effects.  Recommendation continue carbamazepine and lamotrigine.  Seizure precautions.  Ativan 2 mg as needed for seizure activity greater than 2 minutes, every 5 min for up to x 2 doses, please notify neurology if Ativan is used for consideration of dose adjustment of his standing antiseizure medication regimen   Marita Burnsed 02/21/2023, 1:06 AM

## 2023-02-22 DIAGNOSIS — A419 Sepsis, unspecified organism: Secondary | ICD-10-CM | POA: Diagnosis not present

## 2023-02-22 DIAGNOSIS — R6520 Severe sepsis without septic shock: Secondary | ICD-10-CM

## 2023-02-22 DIAGNOSIS — G40909 Epilepsy, unspecified, not intractable, without status epilepticus: Secondary | ICD-10-CM | POA: Diagnosis not present

## 2023-02-22 LAB — COMPREHENSIVE METABOLIC PANEL
ALT: 47 U/L — ABNORMAL HIGH (ref 0–44)
AST: 40 U/L (ref 15–41)
Albumin: 3.3 g/dL — ABNORMAL LOW (ref 3.5–5.0)
Alkaline Phosphatase: 96 U/L (ref 38–126)
Anion gap: 5 (ref 5–15)
BUN: 14 mg/dL (ref 6–20)
CO2: 30 mmol/L (ref 22–32)
Calcium: 9.4 mg/dL (ref 8.9–10.3)
Chloride: 110 mmol/L (ref 98–111)
Creatinine, Ser: 0.91 mg/dL (ref 0.61–1.24)
GFR, Estimated: >60 mL/min (ref >60)
Glucose, Bld: 103 mg/dL — ABNORMAL HIGH (ref 70–99)
Potassium: 4.1 mmol/L (ref 3.5–5.1)
Sodium: 145 mmol/L (ref 135–145)
Total Bilirubin: 0.5 mg/dL (ref <1.2)
Total Protein: 6 g/dL — ABNORMAL LOW (ref 6.5–8.1)

## 2023-02-22 LAB — CBC WITH DIFFERENTIAL/PLATELET
Abs Immature Granulocytes: 0.01 10*3/uL (ref 0.00–0.07)
Basophils Absolute: 0 10*3/uL (ref 0.0–0.1)
Basophils Relative: 0 %
Eosinophils Absolute: 0 10*3/uL (ref 0.0–0.5)
Eosinophils Relative: 1 %
HCT: 34.8 % — ABNORMAL LOW (ref 39.0–52.0)
Hemoglobin: 11.8 g/dL — ABNORMAL LOW (ref 13.0–17.0)
Immature Granulocytes: 0 %
Lymphocytes Relative: 15 %
Lymphs Abs: 0.6 10*3/uL — ABNORMAL LOW (ref 0.7–4.0)
MCH: 33.6 pg (ref 26.0–34.0)
MCHC: 33.9 g/dL (ref 30.0–36.0)
MCV: 99.1 fL (ref 80.0–100.0)
Monocytes Absolute: 0.3 10*3/uL (ref 0.1–1.0)
Monocytes Relative: 8 %
Neutro Abs: 3.1 10*3/uL (ref 1.7–7.7)
Neutrophils Relative %: 76 %
Platelets: 132 10*3/uL — ABNORMAL LOW (ref 150–400)
RBC: 3.51 MIL/uL — ABNORMAL LOW (ref 4.22–5.81)
RDW: 12.1 % (ref 11.5–15.5)
WBC: 4.1 10*3/uL (ref 4.0–10.5)
nRBC: 0 % (ref 0.0–0.2)

## 2023-02-22 LAB — PHOSPHORUS: Phosphorus: 2.9 mg/dL (ref 2.5–4.6)

## 2023-02-22 LAB — URINE CULTURE: Culture: NO GROWTH

## 2023-02-22 LAB — MAGNESIUM: Magnesium: 2 mg/dL (ref 1.7–2.4)

## 2023-02-22 MED ORDER — LACTULOSE 10 GM/15ML PO SOLN
10.0000 g | Freq: Two times a day (BID) | ORAL | Status: DC
  Administered 2023-02-22 (×2): 10 g via ORAL
  Filled 2023-02-22: qty 30

## 2023-02-22 MED ORDER — PANTOPRAZOLE SODIUM 40 MG PO TBEC
40.0000 mg | DELAYED_RELEASE_TABLET | Freq: Every day | ORAL | Status: DC
  Administered 2023-02-23: 40 mg via ORAL
  Filled 2023-02-22: qty 1

## 2023-02-22 MED ORDER — ORAL CARE MOUTH RINSE: 15.0000 mL | OROMUCOSAL | Status: DC | PRN

## 2023-02-22 MED ORDER — ORAL CARE MOUTH RINSE
15.0000 mL | OROMUCOSAL | Status: DC
  Administered 2023-02-22 – 2023-02-23 (×5): 15 mL via OROMUCOSAL

## 2023-02-22 MED ORDER — CHLORHEXIDINE GLUCONATE CLOTH 2 % EX PADS
6.0000 | MEDICATED_PAD | Freq: Every day | CUTANEOUS | Status: DC
  Administered 2023-02-22 – 2023-02-23 (×2): 6 via TOPICAL

## 2023-02-22 NOTE — Progress Notes (Addendum)
PROGRESS NOTE    GARREN OBA  NWG:[id number] DOB: 05/10/1990 DOA: 02/20/2023 PCP: Gaspar Garbe, MD    Brief Narrative:  32 year old with history of viral encephalitis and chronic encephalopathy , seizures, baseline nonverbal, wheelchair-bound and total care at home brought to the hospital with him not acting right, sleeping a lot, was not wanting to get up at all. In the emergency room temperature was 91.4, WBC count was 2.3. Blood pressure was stable. Treated as sepsis, heated with Bair hugger and is stabilized. Neurology consulted. CT scan of the brain without any acute abnormalities.  Admitted and treated as severe sepsis.  Subjective: Patient seen and examined.  Mother at the bedside.  He is grinding his teeth and making some noise and shaking his head.  According to the mother at the bedside this is his usual behavior and he is back to his baseline. Leukopenia is improving.  Temperature has normalized.  No culture data yet. No BM for last 3 days.  Will use some lactulose.  Assessment & Plan:   Severe sepsis of unknown source.  Presented with hypothermia and leukopenia. Already clinically stabilizing.  Infection workup including respiratory virus panel, chest x-ray, abdominal x-ray, urine and blood cultures are negative so far.  Initially received vancomycin and Rocephin.  Will keep on Rocephin today.  With his all clinical improvement, will likely discontinue antibiotics if no bacterial growth.  Seizure disorder, chronic encephalopathy: Improved. Continue carbamazepine, lamotrigine, Ativan, risperidone.  Suspected GI bleeding: No evidence of GI bleeding.  Hemoglobin has remained stable.  He is on iron supplementation making his bowel movement melanotic.  Addendum: Documentation clarification.  This patient has sepsis, no severe sepsis.   DVT prophylaxis: SCDs Start: 02/21/23 0208   Code Status: Full code Family Communication: Mother at bedside Disposition Plan:  Status is: Inpatient Remains inpatient appropriate because: IV antibiotics     Consultants:  Neurology  Procedures:  None  Antimicrobials:  Rocephin 11/28---     Objective: Vitals:   02/21/23 2341 02/22/23 0326 02/22/23 0830 02/22/23 1134  BP: 110/80 118/77 (!) 115/91 129/82  Pulse: 71 76 78 81  Resp: 18 18 18 17   Temp: (!) 97.5 F (36.4 C) (!) 97.5 F (36.4 C) 98.5 F (36.9 C) 98.4 F (36.9 C)  TempSrc: Oral Oral Oral Oral  SpO2: 98% 100% 98% 98%  Weight:      Height:        Intake/Output Summary (Last 24 hours) at 02/22/2023 1413 Last data filed at 02/22/2023 0600 Gross per 24 hour  Intake 1290.7 ml  Output 4950 ml  Net -3659.3 ml   Filed Weights   02/20/23 2113 02/21/23 0301  Weight: 44 kg 44 kg    Examination:  General exam: Appears calm and comfortable  Respiratory system: No added sounds. Cardiovascular system: S1 & S2 heard, RRR.  Gastrointestinal system: Soft.  Nontender.  Bowel sounds present. Central nervous system: Patient is alert.  He is unable to communicate or follow commands at baseline.  Head-nodding, grimacing and teeth grinding normal behavior as per family.   Data Reviewed: I have personally reviewed following labs and imaging studies  CBC: Recent Labs  Lab 02/20/23 1935 02/21/23 0028 02/21/23 0417 02/22/23 0420  WBC 2.3*  --   --  4.1  NEUTROABS  --   --   --  3.1  HGB 12.7* 9.9* 11.0* 11.8*  HCT 36.9* 29.0* 31.9* 34.8*  MCV 99.2  --   --  99.1  PLT 117*  --   --  132*   Basic Metabolic Panel: Recent Labs  Lab 02/20/23 1935 02/20/23 2049 02/21/23 0028 02/21/23 0417 02/22/23 0420  NA 145  --  145 144 145  K 4.1  --  4.0 4.0 4.1  CL 103  --   --  106 110  CO2 34*  --   --  33* 30  GLUCOSE 95  --   --  70 103*  BUN 21*  --   --  17 14  CREATININE 0.91  --   --  0.98 0.91  CALCIUM 9.8  --   --  8.9 9.4  MG  --  2.8*  --  2.5* 2.0  PHOS  --   --   --   --  2.9   GFR: Estimated Creatinine Clearance: 71.2 mL/min  (by C-G formula based on SCr of 0.91 mg/dL). Liver Function Tests: Recent Labs  Lab 02/20/23 2049 02/22/23 0420  AST 41 40  ALT 57* 47*  ALKPHOS 112 96  BILITOT 0.7 0.5  PROT 6.6 6.0*  ALBUMIN 4.0 3.3*   No results for input(s): "LIPASE", "AMYLASE" in the last 168 hours. No results for input(s): "AMMONIA" in the last 168 hours. Coagulation Profile: Recent Labs  Lab 02/20/23 2049  INR 0.9   Cardiac Enzymes: Recent Labs  Lab 02/20/23 2049  CKTOTAL 39*   BNP (last 3 results) No results for input(s): "PROBNP" in the last 8760 hours. HbA1C: No results for input(s): "HGBA1C" in the last 72 hours. CBG: Recent Labs  Lab 02/20/23 1937  GLUCAP 92   Lipid Profile: No results for input(s): "CHOL", "HDL", "LDLCALC", "TRIG", "CHOLHDL", "LDLDIRECT" in the last 72 hours. Thyroid Function Tests: Recent Labs    02/20/23 2049  TSH 4.394  FREET4 0.58*   Anemia Panel: Recent Labs    02/21/23 0417  VITAMINB12 1,222*  FOLATE 25.9  FERRITIN 292  TIBC 283  IRON 121  RETICCTPCT 1.0   Sepsis Labs: Recent Labs  Lab 02/20/23 2049 02/20/23 2110 02/21/23 0417  LATICACIDVEN 0.7 0.6 0.6    Recent Results (from the past 240 hour(s))  Resp panel by RT-PCR (RSV, Flu A&B, Covid) Anterior Nasal Swab     Status: None   Collection Time: 02/20/23  8:27 PM   Specimen: Anterior Nasal Swab  Result Value Ref Range Status   SARS Coronavirus 2 by RT PCR NEGATIVE NEGATIVE Final   Influenza A by PCR NEGATIVE NEGATIVE Final   Influenza B by PCR NEGATIVE NEGATIVE Final    Comment: (NOTE) The Xpert Xpress SARS-CoV-2/FLU/RSV plus assay is intended as an aid in the diagnosis of influenza from Nasopharyngeal swab specimens and should not be used as a sole basis for treatment. Nasal washings and aspirates are unacceptable for Xpert Xpress SARS-CoV-2/FLU/RSV testing.  Fact Sheet for Patients: BloggerCourse.com  Fact Sheet for Healthcare  Providers: SeriousBroker.it  This test is not yet approved or cleared by the [place] FDA and has been authorized for detection and/or diagnosis of SARS-CoV-2 by FDA under an Emergency Use Authorization (EUA). This EUA will remain in effect (meaning this test can be used) for the duration of the COVID-19 declaration under Section 564(b)(1) of the Act, 21 U.S.C. section 360bbb-3(b)(1), unless the authorization is terminated or revoked.     Resp Syncytial Virus by PCR NEGATIVE NEGATIVE Final    Comment: (NOTE) Fact Sheet for Patients: BloggerCourse.com  Fact Sheet for Healthcare Providers: SeriousBroker.it  This test is not yet approved or cleared by the [place] and  has been authorized for detection and/or diagnosis of SARS-CoV-2 by FDA under an Emergency Use Authorization (EUA). This EUA will remain in effect (meaning this test can be used) for the duration of the COVID-19 declaration under Section 564(b)(1) of the Act, 21 U.S.C. section 360bbb-3(b)(1), unless the authorization is terminated or revoked.  Performed at [facility], [address] [place], [name] [address]   Blood Culture (routine x 2)     Status: None (Preliminary result)   Collection Time: 02/20/23  8:35 PM   Specimen: BLOOD  Result Value Ref Range Status   Specimen Description BLOOD SITE NOT SPECIFIED  Final   Special Requests   Final    BOTTLES DRAWN AEROBIC AND ANAEROBIC Blood Culture results may not be optimal due to an inadequate volume of blood received in culture bottles   Culture   Final    NO GROWTH 2 DAYS Performed at [facility] [place], [address] [place], [name] [address]    Report Status PENDING  Incomplete  Urine Culture     Status: None   Collection Time: 02/21/23  1:44 AM   Specimen: Urine, Catheterized  Result Value Ref Range Status   Specimen Description URINE, CATHETERIZED  Final    Special Requests NONE  Final   Culture   Final    NO GROWTH Performed at [facility], [address] [place], [name] [address]    Report Status 02/22/2023 FINAL  Final         Radiology Studies: CT ANGIO GI BLEED  Result Date: 02/21/2023 CLINICAL DATA:  32 year old male with viral encephalitis and sequela of chronic encephalopathy and seizures. Parents report that patient has not been behaving at baseline for the past 5 days. Hypothermia. Decreasing hemoglobin. Fecal occult blood positive. EXAM: CTA ABDOMEN AND PELVIS WITHOUT AND WITH CONTRAST TECHNIQUE: Multidetector CT imaging of the abdomen and pelvis was performed using the standard protocol during bolus administration of intravenous contrast. Multiplanar reconstructed images and MIPs were obtained and reviewed to evaluate the vascular anatomy. RADIATION DOSE REDUCTION: This exam was performed according to the departmental dose-optimization program which includes automated exposure control, adjustment of the mA and/or kV according to patient size and/or use of iterative reconstruction technique. CONTRAST:  80mL OMNIPAQUE IOHEXOL 350 MG/ML SOLN COMPARISON:  Chest radiograph 02/20/2023; CT abdomen and pelvis 07/11/2021 FINDINGS: VASCULAR The aorta and its mesenteric, renal, and iliac artery branches without hemodynamically significant stenosis. No aortic aneurysm or dissection. Review of the MIP images confirms the above findings. NON-VASCULAR Lower chest: No acute abnormality. Hepatobiliary: Liver, gallbladder, and biliary tree are unremarkable. Pancreas: Unremarkable. Spleen: Unremarkable. Adrenals/Urinary Tract: Normal kidneys and adrenal glands. Foley catheter and locules of gas in the bladder. Stomach/Bowel: Stomach is within normal limits. Large colonic stool burden no bowel wall thickening. No evidence of active GI bleeding. The appendix is not definitively visualized. No secondary signs of appendicitis. Lymphatic: No  lymphadenopathy. Reproductive: Unremarkable. Other: No free intraperitoneal fluid or air. Musculoskeletal: No acute fracture. Plate screw fixation left femur. IMPRESSION: 1. No evidence of active GI bleeding. 2. Large colonic stool burden. Electronically Signed   By: Minerva Fester M.D.   On: 02/21/2023 03:44   CT Head Wo Contrast  Result Date: 02/21/2023 CLINICAL DATA:  Mental status change lethargy EXAM: CT HEAD WITHOUT CONTRAST TECHNIQUE: Contiguous axial images were obtained from the base of the skull through the vertex without intravenous contrast. RADIATION DOSE REDUCTION: This exam was performed according to the departmental  dose-optimization program which includes automated exposure control, adjustment of the mA and/or kV according to patient size and/or use of iterative reconstruction technique. COMPARISON:  None Available. FINDINGS: Brain: No acute territorial infarction, hemorrhage or intracranial mass. Multifocal encephalomalacia involving the frontal, parietal and occipital lobes. Ex vacuo enlargement of the lateral ventricles. Encephalomalacia within the ganglial capsular regions. No mass effect or midline shift Vascular: No hyperdense vessels.  No unexpected calcification Skull: Normal. Negative for fracture or focal lesion. Sinuses/Orbits: Mucosal thickening left maxillary sinus Other: None IMPRESSION: 1. No CT evidence for acute intracranial abnormality. 2. Multifocal encephalomalacia involving the bilateral cerebral hemispheres with ex vacuo enlargement of the lateral ventricles. Electronically Signed   By: Jasmine Pang M.D.   On: 02/21/2023 00:00   DG Chest Portable 1 View  Result Date: 02/20/2023 CLINICAL DATA:  hypothermia EXAM: PORTABLE CHEST 1 VIEW COMPARISON:  Chest x-ray 11/13/2015 FINDINGS: The heart and mediastinal contours are within normal limits. Low lung volumes no focal consolidation. No pulmonary edema. No pleural effusion. No pneumothorax. No acute osseous abnormality.  IMPRESSION: Low lung volumes with no active disease. Electronically Signed   By: Tish Frederickson M.D.   On: 02/20/2023 20:54        Scheduled Meds:  carbamazepine  500 mg Oral BID   Chlorhexidine Gluconate Cloth  6 each Topical Daily   lactulose  10 g Oral BID   lamoTRIgine  150 mg Oral BID   mouth rinse  15 mL Mouth Rinse 4 times per day   [START ON 02/23/2023] pantoprazole  40 mg Oral Daily   risperiDONE  2 mg Oral BID   Continuous Infusions:  cefTRIAXone (ROCEPHIN)  IV 1 g (02/22/23 0859)     LOS: 1 day    Time spent: 35 minutes    Dorcas Carrow, MD [facility]

## 2023-02-23 DIAGNOSIS — T68XXXA Hypothermia, initial encounter: Secondary | ICD-10-CM | POA: Diagnosis not present

## 2023-02-23 MED ORDER — AMOXICILLIN-POT CLAVULANATE 875-125 MG PO TABS
1.0000 | ORAL_TABLET | Freq: Two times a day (BID) | ORAL | 0 refills | Status: AC
Start: 2023-02-23 — End: 2023-02-26

## 2023-02-23 MED ORDER — POLYETHYLENE GLYCOL 3350 17 G PO PACK: 17.0000 g | PACK | Freq: Every day | ORAL | 0 refills | Status: AC

## 2023-02-23 MED ORDER — FERROUS SULFATE 325 (65 FE) MG PO TABS: 325.0000 mg | ORAL_TABLET | ORAL | Status: DC

## 2023-02-23 NOTE — Plan of Care (Signed)

## 2023-02-23 NOTE — Progress Notes (Signed)
Pt. Urinate S/P foley removal,. D/C home with parents.

## 2023-02-23 NOTE — Plan of Care (Signed)
   Problem: Coping: Goal: Level of anxiety will decrease Outcome: Progressing   Problem: Elimination: Goal: Will not experience complications related to bowel motility Outcome: Progressing Goal: Will not experience complications related to urinary retention Outcome: Progressing   Problem: Pain Management: Goal: General experience of comfort will improve Outcome: Progressing   Problem: Safety: Goal: Ability to remain free from injury will improve Outcome: Progressing   Problem: Skin Integrity: Goal: Risk for impaired skin integrity will decrease Outcome: Progressing

## 2023-02-23 NOTE — Plan of Care (Signed)
  Problem: Education: Goal: Knowledge of General Education information will improve Description: Including pain rating scale, medication(s)/side effects and non-pharmacologic comfort measures 02/23/2023 1806 by Beryle Flock, RN Outcome: Adequate for Discharge 02/23/2023 1805 by Beryle Flock, RN Outcome: Adequate for Discharge 02/23/2023 1103 by Beryle Flock, RN Outcome: Progressing   Problem: Health Behavior/Discharge Planning: Goal: Ability to manage health-related needs will improve 02/23/2023 1806 by Beryle Flock, RN Outcome: Adequate for Discharge 02/23/2023 1805 by Beryle Flock, RN Outcome: Adequate for Discharge 02/23/2023 1103 by Beryle Flock, RN Outcome: Progressing   Problem: Clinical Measurements: Goal: Ability to maintain clinical measurements within normal limits will improve 02/23/2023 1806 by Beryle Flock, RN Outcome: Adequate for Discharge 02/23/2023 1805 by Beryle Flock, RN Outcome: Adequate for Discharge 02/23/2023 1103 by Beryle Flock, RN Outcome: Progressing Goal: Will remain free from infection 02/23/2023 1806 by Beryle Flock, RN Outcome: Adequate for Discharge 02/23/2023 1805 by Beryle Flock, RN Outcome: Adequate for Discharge 02/23/2023 1103 by Beryle Flock, RN Outcome: Progressing Goal: Diagnostic test results will improve 02/23/2023 1806 by Beryle Flock, RN Outcome: Adequate for Discharge 02/23/2023 1805 by Beryle Flock, RN Outcome: Adequate for Discharge 02/23/2023 1103 by Beryle Flock, RN Outcome: Progressing Goal: Respiratory complications will improve 02/23/2023 1806 by Beryle Flock, RN Outcome: Adequate for Discharge 02/23/2023 1805 by Beryle Flock, RN Outcome: Adequate for Discharge 02/23/2023 1103 by Beryle Flock, RN Outcome: Progressing Goal: Cardiovascular complication will be avoided 02/23/2023 1806 by Beryle Flock, RN Outcome: Adequate for Discharge 02/23/2023 1805 by Beryle Flock, RN Outcome: Adequate for Discharge 02/23/2023  1103 by Beryle Flock, RN Outcome: Progressing   Problem: Activity: Goal: Risk for activity intolerance will decrease 02/23/2023 1806 by Beryle Flock, RN Outcome: Adequate for Discharge 02/23/2023 1805 by Beryle Flock, RN Outcome: Adequate for Discharge 02/23/2023 1103 by Beryle Flock, RN Outcome: Progressing   Problem: Nutrition: Goal: Adequate nutrition will be maintained 02/23/2023 1806 by Beryle Flock, RN Outcome: Adequate for Discharge 02/23/2023 1805 by Beryle Flock, RN Outcome: Adequate for Discharge 02/23/2023 1103 by Beryle Flock, RN Outcome: Progressing   Problem: Coping: Goal: Level of anxiety will decrease 02/23/2023 1806 by Beryle Flock, RN Outcome: Adequate for Discharge 02/23/2023 1805 by Beryle Flock, RN Outcome: Adequate for Discharge 02/23/2023 1103 by Beryle Flock, RN Outcome: Progressing   Problem: Elimination: Goal: Will not experience complications related to bowel motility 02/23/2023 1806 by Beryle Flock, RN Outcome: Adequate for Discharge 02/23/2023 1805 by Beryle Flock, RN Outcome: Adequate for Discharge 02/23/2023 1103 by Beryle Flock, RN Outcome: Progressing Goal: Will not experience complications related to urinary retention 02/23/2023 1806 by Beryle Flock, RN Outcome: Adequate for Discharge 02/23/2023 1805 by Beryle Flock, RN Outcome: Adequate for Discharge 02/23/2023 1103 by Beryle Flock, RN Outcome: Progressing   Problem: Pain Management: Goal: General experience of comfort will improve 02/23/2023 1806 by Beryle Flock, RN Outcome: Adequate for Discharge 02/23/2023 1805 by Beryle Flock, RN Outcome: Adequate for Discharge 02/23/2023 1103 by Beryle Flock, RN Outcome: Progressing   Problem: Safety: Goal: Ability to remain free from injury will improve 02/23/2023 1806 by Beryle Flock, RN Outcome: Adequate for Discharge 02/23/2023 1805 by Beryle Flock, RN Outcome: Adequate for Discharge 02/23/2023 1103 by Beryle Flock, RN Outcome:  Progressing   Problem: Skin Integrity: Goal: Risk for impaired skin integrity will decrease 02/23/2023 1806 by Beryle Flock, RN Outcome: Adequate for Discharge 02/23/2023 1805 by Beryle Flock, RN Outcome: Adequate for Discharge 02/23/2023 1103 by Beryle Flock, RN Outcome: Progressing

## 2023-02-23 NOTE — Discharge Summary (Signed)
Physician Discharge Summary  Kent Peterson GNF:[id number] DOB: 03/24/1991 DOA: 02/20/2023  PCP: Gaspar Garbe, MD  Admit date: 02/20/2023 Discharge date: 02/23/2023  Time spent: 40 minutes  Recommendations for Outpatient Follow-up:  Follow outpatient CBC/CMP  Follow clinical status on antibiotics, ensure continued improvement after d/c S/p foley catheter here, follow outpatient   Follow positive hemoccult outpatient   Discharge Diagnoses:  Principal Problem:   Hypothermia Active Problems:   Seizure disorder (HCC)   Chronic static encephalopathy   Leukopenia   Discharge Condition: stable  Diet recommendation: as tolerated  Filed Weights   02/20/23 2113 02/21/23 0301  Weight: 44 kg 44 kg    History of present illness:   32 year old with history of viral encephalitis and chronic encephalopathy , seizures, baseline nonverbal, wheelchair-bound and total care at home brought to the hospital with him not acting right, sleeping Kent Peterson lot, was not wanting to get up at all. In the emergency room temperature was 91.4, WBC count was 2.3. Blood pressure was stable. Treated as sepsis, heated with Bair hugger and is stabilized. Neurology consulted. CT scan of the brain without any acute abnormalities.  Admitted and treated as severe sepsis.   Hospital Course:  Assessment and Plan:  Systemic Inflammatory Response Syndrome Hypothermia and leukopenia at presentation  CXR without acute findings.  CT angio GI bleed without acute finding.   UA with RBC's.  Urine culture no growth. No clearly identified source of infection    Given improvement on abx with significant presentation, will continue on augmentin at discharge   Seizure disorder Acute on chronic encephalopathy:  Continue carbamazepine, lamotrigine, Ativan, risperidone Per discussion with father, he's improving, closer to his baseline   Foley Catheter in place Will d/c prior to discharge  Concern for GI bleeding: No  evidence of GI bleeding.  Hemoglobin has remained stable.  He is on iron supplementation making his bowel movement melanotic.  Follow hemoccult positive test outpatient.     Procedures: none   Consultations: none  Discharge Exam: Vitals:   02/23/23 0822 02/23/23 1134  BP: 120/80 110/86  Pulse: 94 90  Resp: 19 17  Temp: 99.1 F (37.3 C) 98.6 F (37 C)  SpO2: 97% 97%   Father at bedside Notes he's getting closer to his baseline, gradually improving Open to bringing him home today  General: No acute distress. Cardiovascular: Heart sounds show Kent Peterson regular rate, and rhythm. Lungs: Clear to auscultation bilaterally Neurological: Alert. Clicking through his teeth.  Moves all extremities 4. Cranial nerves II through XII grossly intact. Extremities: No clubbing or cyanosis. No edema.  Discharge Instructions   Discharge Instructions     Call MD for:  difficulty breathing, headache or visual disturbances   Complete by: As directed    Call MD for:  extreme fatigue   Complete by: As directed    Call MD for:  hives   Complete by: As directed    Call MD for:  persistant dizziness or light-headedness   Complete by: As directed    Call MD for:  persistant nausea and vomiting   Complete by: As directed    Call MD for:  redness, tenderness, or signs of infection (pain, swelling, redness, odor or green/yellow discharge around incision site)   Complete by: As directed    Call MD for:  severe uncontrolled pain   Complete by: As directed    Call MD for:  temperature >100.4   Complete by: As directed    Diet - low  sodium heart healthy   Complete by: As directed    Discharge instructions   Complete by: As directed    You were seen for findings concerning for an infection.   No source of infection was clearly identified, but you've improved with antibiotics.  We'll continue augmentin for another 3 days.    Watch for continued improvement.  If you have concern that he's declining  again, please return to care.  You have pressure ulcers on your left ankle.  Watch these areas closely and offload them at home.  Return for new, recurrent, or worsening symptoms.  Please ask your PCP to request records from this hospitalization so they know what was done and what the next steps will be.   Increase activity slowly   Complete by: As directed       Allergies as of 02/23/2023       Reactions   Latex Other (See Comments)   Not allergic        Medication List     TAKE these medications    amoxicillin-clavulanate 875-125 MG tablet Commonly known as: AUGMENTIN Take 1 tablet by mouth 2 (two) times daily for 3 days.   carbamazepine 100 MG 12 hr capsule Commonly known as: CARBATROL TAKE 1 CAPSULE BY MOUTH TWICE Paw Karstens DAY What changed:  when to take this additional instructions   carbamazepine 200 MG 12 hr capsule Commonly known as: CARBATROL TAKE 2 CAPSULES BY MOUTH 2 TIMES DAILY. What changed: See the new instructions.   ferrous sulfate 325 (65 FE) MG tablet Take 1 tablet (325 mg total) by mouth every other day. What changed: when to take this   lamoTRIgine 150 MG tablet Commonly known as: LAMICTAL TAKE 1 TABLET BY MOUTH TWICE Kent Peterson DAY   loratadine 10 MG tablet Commonly known as: CLARITIN Take 10 mg by mouth daily as needed for allergies.   LORazepam 1 MG tablet Commonly known as: ATIVAN TAKE 1 TABLET BY MOUTH AT BEDTIME AS NEEDED FOR ANXIETY What changed: See the new instructions.   magnesium gluconate 500 MG tablet Commonly known as: MAGONATE Take 500 mg by mouth daily.   mupirocin ointment 2 % Commonly known as: BACTROBAN Apply 1 Application topically 3 (three) times daily.   pantoprazole 40 MG tablet Commonly known as: PROTONIX Take 40 mg by mouth daily.   polyethylene glycol 17 g packet Commonly known as: MiraLax Take 17 g by mouth daily.   PRENATAL PO Take 1 tablet by mouth daily. Unknown brand/dosage   risperiDONE 2 MG  tablet Commonly known as: RISPERDAL Take 2 mg by mouth 2 (two) times daily.       Allergies  Allergen Reactions   Latex Other (See Comments)    Not allergic      The results of significant diagnostics from this hospitalization (including imaging, microbiology, ancillary and laboratory) are listed below for reference.    Significant Diagnostic Studies: CT ANGIO GI BLEED  Result Date: 02/21/2023 CLINICAL DATA:  32 year old male with viral encephalitis and sequela of chronic encephalopathy and seizures. Parents report that patient has not been behaving at baseline for the past 5 days. Hypothermia. Decreasing hemoglobin. Fecal occult blood positive. EXAM: CTA ABDOMEN AND PELVIS WITHOUT AND WITH CONTRAST TECHNIQUE: Multidetector CT imaging of the abdomen and pelvis was performed using the standard protocol during bolus administration of intravenous contrast. Multiplanar reconstructed images and MIPs were obtained and reviewed to evaluate the vascular anatomy. RADIATION DOSE REDUCTION: This exam was performed according to the departmental  dose-optimization program which includes automated exposure control, adjustment of the mA and/or kV according to patient size and/or use of iterative reconstruction technique. CONTRAST:  80mL OMNIPAQUE IOHEXOL 350 MG/ML SOLN COMPARISON:  Chest radiograph 02/20/2023; CT abdomen and pelvis 07/11/2021 FINDINGS: VASCULAR The aorta and its mesenteric, renal, and iliac artery branches without hemodynamically significant stenosis. No aortic aneurysm or dissection. Review of the MIP images confirms the above findings. NON-VASCULAR Lower chest: No acute abnormality. Hepatobiliary: Liver, gallbladder, and biliary tree are unremarkable. Pancreas: Unremarkable. Spleen: Unremarkable. Adrenals/Urinary Tract: Normal kidneys and adrenal glands. Foley catheter and locules of gas in the bladder. Stomach/Bowel: Stomach is within normal limits. Large colonic stool burden no bowel wall  thickening. No evidence of active GI bleeding. The appendix is not definitively visualized. No secondary signs of appendicitis. Lymphatic: No lymphadenopathy. Reproductive: Unremarkable. Other: No free intraperitoneal fluid or air. Musculoskeletal: No acute fracture. Plate screw fixation left femur. IMPRESSION: 1. No evidence of active GI bleeding. 2. Large colonic stool burden. Electronically Signed   By: Minerva Fester M.D.   On: 02/21/2023 03:44   CT Head Wo Contrast  Result Date: 02/21/2023 CLINICAL DATA:  Mental status change lethargy EXAM: CT HEAD WITHOUT CONTRAST TECHNIQUE: Contiguous axial images were obtained from the base of the skull through the vertex without intravenous contrast. RADIATION DOSE REDUCTION: This exam was performed according to the departmental dose-optimization program which includes automated exposure control, adjustment of the mA and/or kV according to patient size and/or use of iterative reconstruction technique. COMPARISON:  None Available. FINDINGS: Brain: No acute territorial infarction, hemorrhage or intracranial mass. Multifocal encephalomalacia involving the frontal, parietal and occipital lobes. Ex vacuo enlargement of the lateral ventricles. Encephalomalacia within the ganglial capsular regions. No mass effect or midline shift Vascular: No hyperdense vessels.  No unexpected calcification Skull: Normal. Negative for fracture or focal lesion. Sinuses/Orbits: Mucosal thickening left maxillary sinus Other: None IMPRESSION: 1. No CT evidence for acute intracranial abnormality. 2. Multifocal encephalomalacia involving the bilateral cerebral hemispheres with ex vacuo enlargement of the lateral ventricles. Electronically Signed   By: Jasmine Pang M.D.   On: 02/21/2023 00:00   DG Chest Portable 1 View  Result Date: 02/20/2023 CLINICAL DATA:  hypothermia EXAM: PORTABLE CHEST 1 VIEW COMPARISON:  Chest x-ray 11/13/2015 FINDINGS: The heart and mediastinal contours are within  normal limits. Low lung volumes no focal consolidation. No pulmonary edema. No pleural effusion. No pneumothorax. No acute osseous abnormality. IMPRESSION: Low lung volumes with no active disease. Electronically Signed   By: Tish Frederickson M.D.   On: 02/20/2023 20:54    Microbiology: Recent Results (from the past 240 hour(s))  Resp panel by RT-PCR (RSV, Flu Shirlyn Savin&B, Covid) Anterior Nasal Swab     Status: None   Collection Time: 02/20/23  8:27 PM   Specimen: Anterior Nasal Swab  Result Value Ref Range Status   SARS Coronavirus 2 by RT PCR NEGATIVE NEGATIVE Final   Influenza Rashee Marschall by PCR NEGATIVE NEGATIVE Final   Influenza B by PCR NEGATIVE NEGATIVE Final    Comment: (NOTE) The Xpert Xpress SARS-CoV-2/FLU/RSV plus assay is intended as an aid in the diagnosis of influenza from Nasopharyngeal swab specimens and should not be used as Terre Hanneman sole basis for treatment. Nasal washings and aspirates are unacceptable for Xpert Xpress SARS-CoV-2/FLU/RSV testing.  Fact Sheet for Patients: BloggerCourse.com  Fact Sheet for Healthcare Providers: SeriousBroker.it  This test is not yet approved or cleared by the [place] FDA and has been authorized for detection and/or diagnosis  of SARS-CoV-2 by FDA under an Emergency Use Authorization (EUA). This EUA will remain in effect (meaning this test can be used) for the duration of the COVID-19 declaration under Section 564(b)(1) of the Act, 21 U.S.C. section 360bbb-3(b)(1), unless the authorization is terminated or revoked.     Resp Syncytial Virus by PCR NEGATIVE NEGATIVE Final    Comment: (NOTE) Fact Sheet for Patients: BloggerCourse.com  Fact Sheet for Healthcare Providers: SeriousBroker.it  This test is not yet approved or cleared by the [place] FDA and has been authorized for detection and/or diagnosis of SARS-CoV-2 by FDA under an Emergency Use  Authorization (EUA). This EUA will remain in effect (meaning this test can be used) for the duration of the COVID-19 declaration under Section 564(b)(1) of the Act, 21 U.S.C. section 360bbb-3(b)(1), unless the authorization is terminated or revoked.  Performed at [facility], [address] [place], [name] [address]   Blood Culture (routine x 2)     Status: None (Preliminary result)   Collection Time: 02/20/23  8:35 PM   Specimen: BLOOD  Result Value Ref Range Status   Specimen Description BLOOD SITE NOT SPECIFIED  Final   Special Requests   Final    BOTTLES DRAWN AEROBIC AND ANAEROBIC Blood Culture results may not be optimal due to an inadequate volume of blood received in culture bottles   Culture   Final    NO GROWTH 3 DAYS Performed at [facility], [address], [place], [name] [address]    Report Status PENDING  Incomplete  Urine Culture     Status: None   Collection Time: 02/21/23  1:44 AM   Specimen: Urine, Catheterized  Result Value Ref Range Status   Specimen Description URINE, CATHETERIZED  Final   Special Requests NONE  Final   Culture   Final    NO GROWTH Performed at [facility], [address], [place], [name] [address]    Report Status 02/22/2023 FINAL  Final     Labs: Basic Metabolic Panel: Recent Labs  Lab 02/20/23 1935 02/20/23 2049 02/21/23 0028 02/21/23 0417 02/22/23 0420  NA 145  --  145 144 145  K 4.1  --  4.0 4.0 4.1  CL 103  --   --  106 110  CO2 34*  --   --  33* 30  GLUCOSE 95  --   --  70 103*  BUN 21*  --   --  17 14  CREATININE 0.91  --   --  0.98 0.91  CALCIUM 9.8  --   --  8.9 9.4  MG  --  2.8*  --  2.5* 2.0  PHOS  --   --   --   --  2.9   Liver Function Tests: Recent Labs  Lab 02/20/23 2049 02/22/23 0420  AST 41 40  ALT 57* 47*  ALKPHOS 112 96  BILITOT 0.7 0.5  PROT 6.6 6.0*  ALBUMIN 4.0 3.3*   No results for input(s): "LIPASE", "AMYLASE" in the last 168 hours. No results for input(s):  "AMMONIA" in the last 168 hours. CBC: Recent Labs  Lab 02/20/23 1935 02/21/23 0028 02/21/23 0417 02/22/23 0420  WBC 2.3*  --   --  4.1  NEUTROABS  --   --   --  3.1  HGB 12.7* 9.9* 11.0* 11.8*  HCT 36.9* 29.0* 31.9* 34.8*  MCV 99.2  --   --  99.1  PLT 117*  --   --  132*   Cardiac Enzymes: Recent Labs  Lab 02/20/23 2049  CKTOTAL 39*   BNP: BNP (last 3 results) No results for input(s): "BNP" in the last 8760 hours.  ProBNP (last 3 results) No results for input(s): "PROBNP" in the last 8760 hours.  CBG: Recent Labs  Lab 02/20/23 1937  GLUCAP 92       Signed:  Lacretia Nicks MD.  [facility] 02/23/2023, 12:46 PM

## 2023-02-24 LAB — LAMOTRIGINE LEVEL: Lamotrigine Lvl: 9.5 ug/mL (ref 2.0–20.0)

## 2023-02-25 LAB — CULTURE, BLOOD (ROUTINE X 2): Culture: NO GROWTH

## 2023-05-07 ENCOUNTER — Other Ambulatory Visit: Payer: Self-pay | Admitting: Adult Health

## 2023-06-30 ENCOUNTER — Encounter: Payer: Self-pay | Admitting: Adult Health

## 2023-06-30 ENCOUNTER — Ambulatory Visit (INDEPENDENT_AMBULATORY_CARE_PROVIDER_SITE_OTHER): Payer: 59 | Admitting: Adult Health

## 2023-06-30 VITALS — BP 90/62 | HR 68 | Temp 97.2°F | Ht 67.0 in | Wt 104.0 lb

## 2023-06-30 DIAGNOSIS — G40909 Epilepsy, unspecified, not intractable, without status epilepticus: Secondary | ICD-10-CM

## 2023-06-30 DIAGNOSIS — Z5181 Encounter for therapeutic drug level monitoring: Secondary | ICD-10-CM | POA: Diagnosis not present

## 2023-06-30 MED ORDER — CARBAMAZEPINE ER 100 MG PO CP12: 100.0000 mg | ORAL_CAPSULE | Freq: Two times a day (BID) | ORAL | 3 refills | Status: AC

## 2023-06-30 MED ORDER — CARBAMAZEPINE ER 200 MG PO CP12: 400.0000 mg | ORAL_CAPSULE | Freq: Two times a day (BID) | ORAL | 3 refills | Status: AC

## 2023-06-30 NOTE — Patient Instructions (Signed)
 Continue Carbatrol and Lamictal Blood work today Advised if temperature drops below 97 she consider taking him to urgent care or the ED.

## 2023-06-30 NOTE — Progress Notes (Signed)
 PATIENT: Kent Peterson DOB: [birth year]  REASON FOR VISIT: follow up HISTORY FROM: Mother  Chief Complaint  Patient presents with   Follow-up    Rm 20, father.  ? Back in Nov 27 noted cool temperature/twitch (has not really been consistent in taking temperature.  Questioning whether he has a  regulating issue?      HISTORY OF PRESENT ILLNESS: Today 06/30/23:  Kent Peterson is a 33 y.o. male with a history of seizures. Returns today for follow-up.  At the last visit he saw Dr. Teresa Coombs.  No changes were made to his medication.  Blood work was checked and carbamazepine was elevated consistent with high dose of carbamazepine.  Since he was last seen he was hospitalized in November for sepsis.  It was noted that his temperature was 91.4 on admission to the ED. he is here today with his dad.  He notes that he has been slightly different the last couple days.  States that he feels a little "cool today."  Reports eating and drinking well.  Reports that he has not had any seizure events that he is aware of.  Remains on carbamazepine 500 mg twice a day and Lamictal 150 mg twice a day   06/24/22: (Copied from Dr. Karie Georges note):  Patient presents today for follow-up, he is accompanied by both parents.  Last visit was a year ago.  Since then he has not had any seizure or seizure like activity.  He is compliant with his lamotrigine and carbamazepine.  They did follow-up with the oncologist, primary care doctor, and GI doctors, was found that he has iron deficiency anemia due to GI bleed, patient was put on PPIs and prenatal vitamins, parents do report that his lab values continue to improve.  He is doing much better.  They do not have any current complaints at the moment.  06/19/21: Kent Peterson is a 33 year old male with a history of viral encephalitis with associated seizures.   He returns today for follow-up with his mother.  She reports no seizures. Mother reports that his Hgb has been low,  protein low- reports that he will be seeing a gastroenterologist tomorrow.  05/30/20: Kent Peterson is a 33 year old male with a history of viral encephalitis with associated seizures.  He returns today for follow-up.  He is here with his mother.  He remains on carbamazepine and Lamictal 150 mg twice a day.  Denies any seizure events.  06/14/19: Kent Peterson is a 33 year old male with a history of viral encephalitis with associated seizures.  He returns today with his mother.  He remains on carbamazepine and Lamictal.  Mother denies any seizure events.  Reports that he tolerates the medication well.  Denies any new issues.  Returns today for follow-up.  HISTORY 03/23/18:   Kent Peterson is a 33 year old male with a history of viral encephalitis with associated seizures.  He returns today for follow-up.  He is currently on carbamazepine and Lamictal.  His mother reports that he has not had any seizure events.  He continues to tolerate the medication well.  The patient primarily uses a wheelchair when ambulating.  His mother states that at home he can stand with assistance.  She reports that he typically does not follow commands and is visually impaired.  He returns today for evaluation.  REVIEW OF SYSTEMS: Out of a complete 14 system review of symptoms, the patient complains only of the following symptoms, and all other reviewed systems are negative.  See HPI  ALLERGIES: Allergies  Allergen Reactions   Latex Other (See Comments)    Not allergic    HOME MEDICATIONS: Outpatient Medications Prior to Visit  Medication Sig Dispense Refill   carbamazepine (CARBATROL) 100 MG 12 hr capsule Take 1 capsule (100 mg total) by mouth See admin instructions. Take 500mg  (one 100mg  capsule and two 200mg  capsules) by mouth twice daily, morning and evening. 180 capsule 0   carbamazepine (CARBATROL) 200 MG 12 hr capsule TAKE 2 CAPSULES BY MOUTH 2 TIMES DAILY. (Patient taking differently: Take 400 mg by mouth See  admin instructions. Take 500mg  (one 100mg  capsule and two 200mg  capsules) by mouth twice daily, morning and evening.) 360 capsule 3   ferrous sulfate 325 (65 FE) MG tablet Take 1 tablet (325 mg total) by mouth every other day.     lamoTRIgine (LAMICTAL) 150 MG tablet TAKE 1 TABLET BY MOUTH TWICE A DAY (Patient taking differently: Take 150 mg by mouth 2 (two) times daily.) 180 tablet 1   loratadine (CLARITIN) 10 MG tablet Take 10 mg by mouth daily as needed for allergies.     LORazepam (ATIVAN) 1 MG tablet TAKE 1 TABLET BY MOUTH AT BEDTIME AS NEEDED FOR ANXIETY (Patient taking differently: Take 1 mg by mouth at bedtime as needed for anxiety.) 30 tablet 1   magnesium gluconate (MAGONATE) 500 MG tablet Take 500 mg by mouth daily.     mupirocin ointment (BACTROBAN) 2 % Apply 1 Application topically 3 (three) times daily.     pantoprazole (PROTONIX) 40 MG tablet Take 40 mg by mouth daily.     polyethylene glycol (MIRALAX) 17 g packet Take 17 g by mouth daily. 14 each 0   Prenatal Vit-Fe Fumarate-FA (PRENATAL PO) Take 1 tablet by mouth daily. Unknown brand/dosage     risperiDONE (RISPERDAL) 2 MG tablet Take 2 mg by mouth 2 (two) times daily.     No facility-administered medications prior to visit.    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   Following viral encephalitis as an infant   Seizure disorder (HCC) 03/12/2017   Seizures (HCC)     PAST SURGICAL HISTORY: Past Surgical History:  Procedure Laterality Date   right leg surgery      FAMILY HISTORY: Family History  Problem Relation Age of Onset   Diabetes Mother    Hypercholesterolemia Mother    Hypertension Father    Healthy Brother     SOCIAL HISTORY: Social History   Socioeconomic History   Marital status: Single    Spouse name: Not on file   Number of children: 0   Years of education: 12   Highest education level: Not on file  Occupational History   Not on  file  Tobacco Use   Smoking status: Never   Smokeless tobacco: Never  Vaping Use   Vaping status: Never Used  Substance and Sexual Activity   Alcohol use: No   Drug use: No   Sexual activity: Not on file  Other Topics Concern   Not on file  Social History Narrative   Lives with parents   Caffeine use:    none   Social Drivers of Corporate investment banker Strain: Not on file  Food Insecurity: Patient Unable To Answer (02/21/2023)   Hunger Vital Sign    Worried About Running Out of Food in the Last Year: Patient unable to answer    Ran Out of Food in  the Last Year: Patient unable to answer  Transportation Needs: Patient Unable To Answer (02/21/2023)   PRAPARE - Transportation    Lack of Transportation (Medical): Patient unable to answer    Lack of Transportation (Non-Medical): Patient unable to answer  Physical Activity: Not on file  Stress: Not on file  Social Connections: Not on file  Intimate Partner Violence: Patient Unable To Answer (02/21/2023)   Humiliation, Afraid, Rape, and Kick questionnaire    Fear of Current or Ex-Partner: Patient unable to answer    Emotionally Abused: Patient unable to answer    Physically Abused: Patient unable to answer    Sexually Abused: Patient unable to answer      PHYSICAL EXAM  Vitals:   06/30/23 0942  BP: 90/62  Pulse: 68  Weight: 104 lb (47.2 kg)  Height: 5\' 7"  (1.702 m)    Body mass index is 16.29 kg/m.  Generalized: Well developed, in no acute distress   Neurological examination  Mentation: Alert.  Does not follow commands.  Nonverbal Cranial nerve II-XII: unable to test Motor: Picks up items and puts it to his mouth. Sensory: Unable to test Coordination: Unable to test Gait and station: In a motorized wheelchair   DIAGNOSTIC DATA (LABS, IMAGING, TESTING) - I reviewed patient records, labs, notes, testing and imaging myself where available.  Lab Results  Component Value Date   WBC 4.1 02/22/2023   HGB  11.8 (L) 02/22/2023   HCT 34.8 (L) 02/22/2023   MCV 99.1 02/22/2023   PLT 132 (L) 02/22/2023      Component Value Date/Time   NA 145 02/22/2023 0420   NA 145 (H) 06/24/2022 1004   K 4.1 02/22/2023 0420   CL 110 02/22/2023 0420   CO2 30 02/22/2023 0420   GLUCOSE 103 (H) 02/22/2023 0420   BUN 14 02/22/2023 0420   BUN 20 06/24/2022 1004   CREATININE 0.91 02/22/2023 0420   CALCIUM 9.4 02/22/2023 0420   PROT 6.0 (L) 02/22/2023 0420   PROT 5.2 (L) 06/19/2020 0925   ALBUMIN 3.3 (L) 02/22/2023 0420   ALBUMIN 3.6 (L) 06/19/2020 0925   AST 40 02/22/2023 0420   ALT 47 (H) 02/22/2023 0420   ALKPHOS 96 02/22/2023 0420   BILITOT 0.5 02/22/2023 0420   BILITOT <0.2 06/19/2020 0925   GFRNONAA >60 02/22/2023 0420   GFRAA 135 06/14/2019 0927      ASSESSMENT AND PLAN 33 y.o. year old male  has a past medical history of Chronic static encephalopathy (03/12/2017), Seizure disorder (HCC) (03/12/2017), and Seizures (HCC). here with:  1.  Seizures  -Continue carbamazepine 500 mg BID: Currently taking Carbatrol 200 mg 2 tablets twice a day in addition to Carbatrol 100 mg tablets twice a day -Continue Lamictal 150 mg twice a day  - Will check carbamazepine and Lamictal level today had recent blood work done at [facility]. -His father was concerned that his temperature may be too low.  We checked it today it was 97.2.  I did advise that if his temperature drops below 97 that would be considered abnormal and he would need to go to urgent care or the emergency department. -Follow-up in 1 year or sooner if needed    Butch Penny, MSN, NP-C 06/30/2023, 9:46 AM [facility] [address], [address] [place], [name] [address] [phone]

## 2023-07-01 ENCOUNTER — Telehealth: Payer: Self-pay | Admitting: *Deleted

## 2023-07-01 LAB — LAMOTRIGINE LEVEL: Lamotrigine Lvl: 8.7 ug/mL (ref 2.0–20.0)

## 2023-07-01 LAB — CARBAMAZEPINE LEVEL, TOTAL: Carbamazepine (Tegretol), S: 15 ug/mL (ref 4.0–12.0)

## 2023-07-01 NOTE — Telephone Encounter (Signed)
 I called mother of pt and gave her the results of the labs. Blood work is consistent with previous blood work.  Carbamazepine level is elevated however no change in therapy as of now.   She verbalized understanding.

## 2023-07-05 IMAGING — CT CT ABD-PELV W/ CM
2 of 4 series · 17 of 46 positions shown, 19 images · IV contrast (APPLIED)
Comparison: None.

CLINICAL DATA: Iron deficiency anemia

EXAM:
CT ABDOMEN AND PELVIS WITH CONTRAST
TECHNIQUE: Multidetector CT imaging of the abdomen and pelvis was performed
using the standard protocol following bolus administration of
intravenous contrast.

[Series 3: abd/ pelvis 5.0 i30f 2 · axial · 0.81mm/px · z∈[+638,+1088]mm · 14 of 100 slices shown, 16 images]
[im 5/100  soft-tissue]
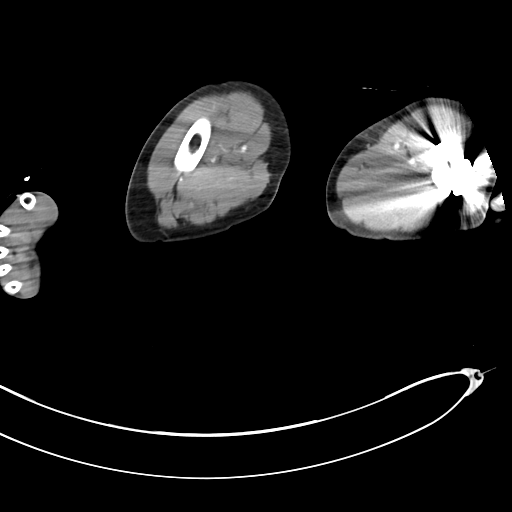
[im 5/100  bone]
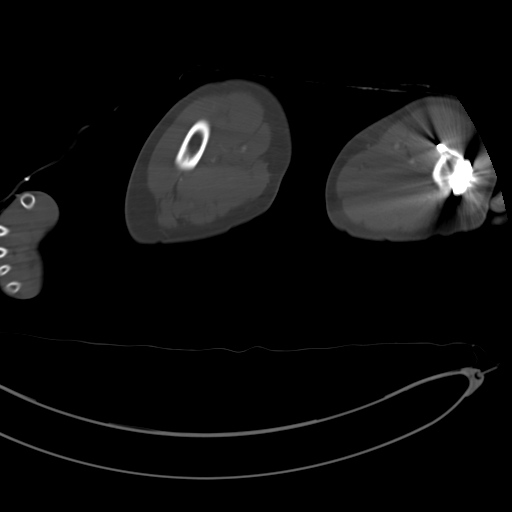
[im 13/100  soft-tissue]
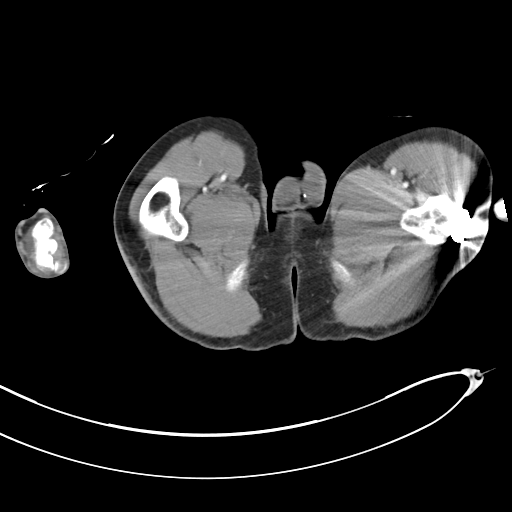
[im 18/100  soft-tissue]
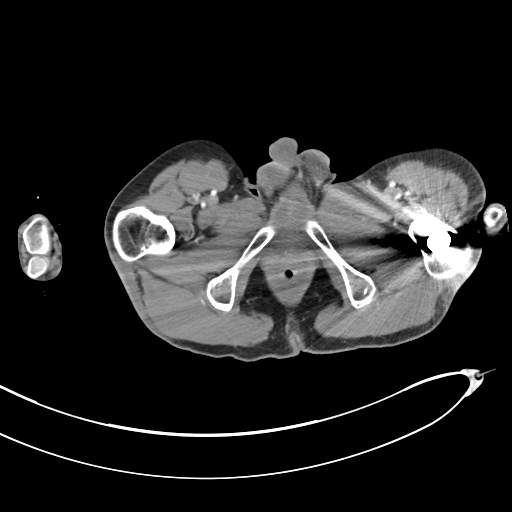
[im 26/100  soft-tissue]
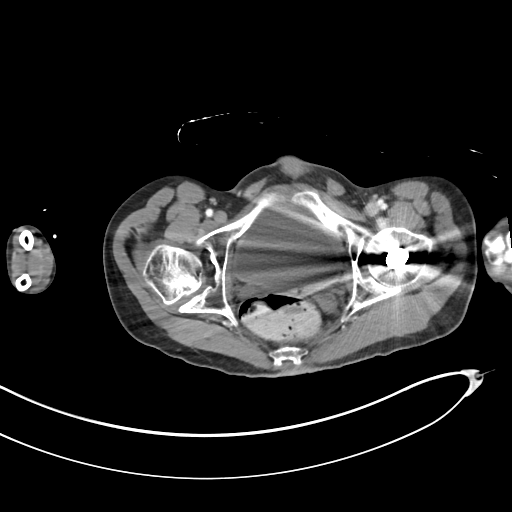
[im 35/100  soft-tissue]
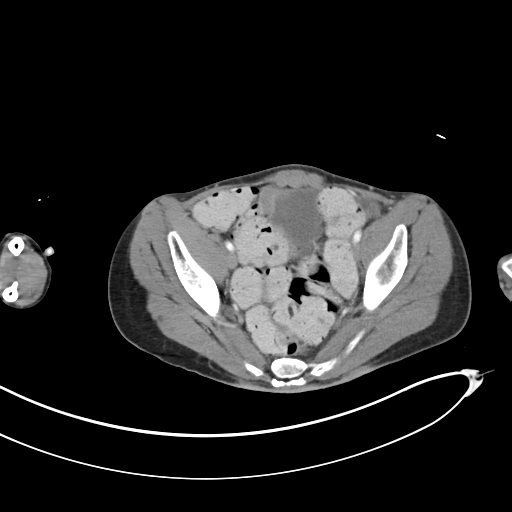
[im 39/100  soft-tissue]
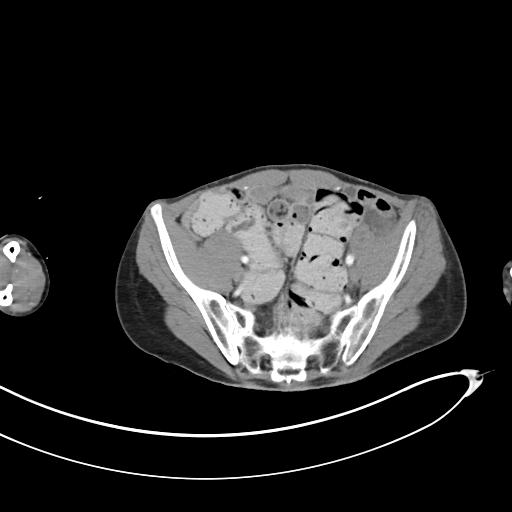
[im 48/100  soft-tissue]
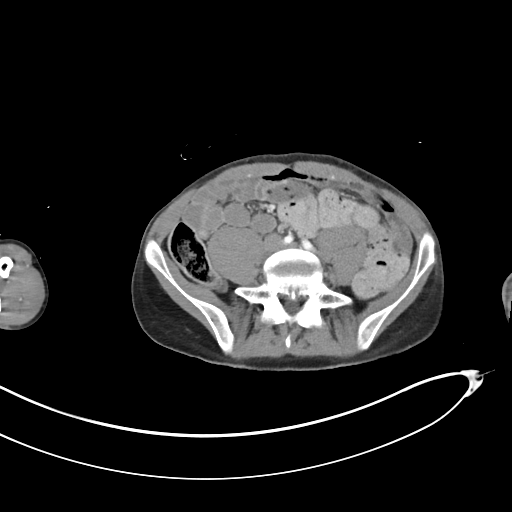
[im 52/100  soft-tissue]
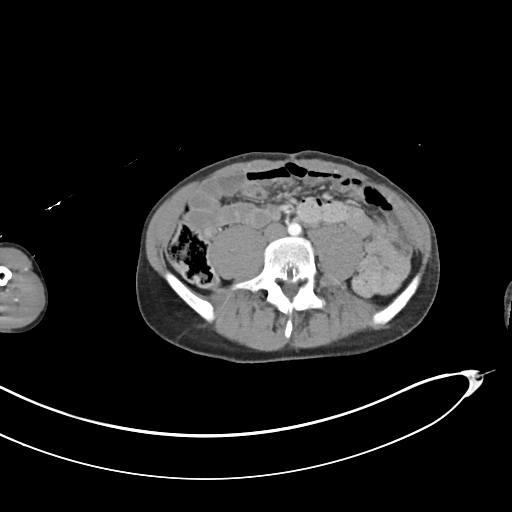
[im 61/100  soft-tissue]
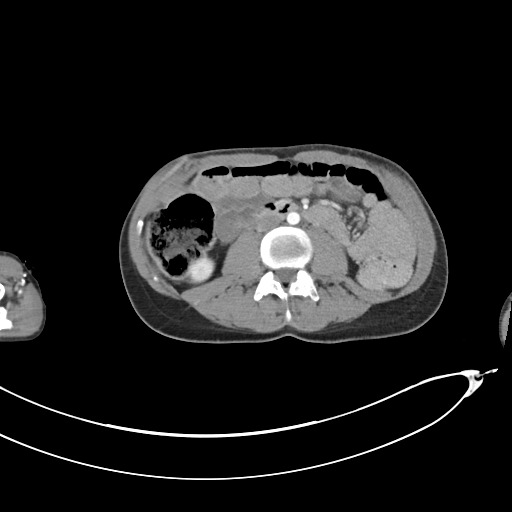
[im 61/100  bone]
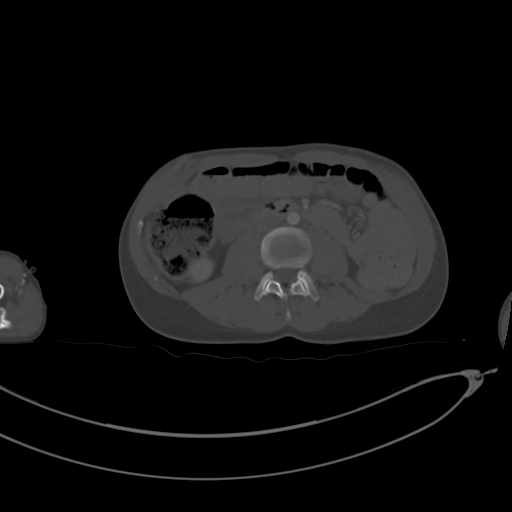
[im 65/100  soft-tissue]
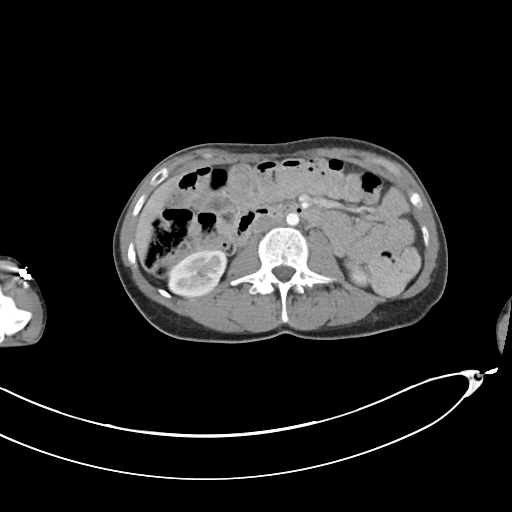
[im 74/100  soft-tissue]
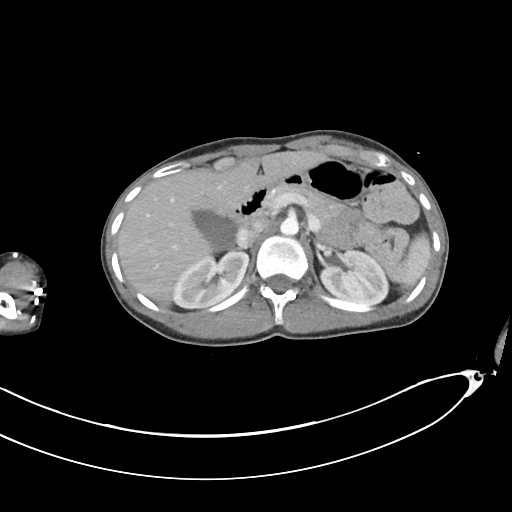
[im 82/100  soft-tissue]
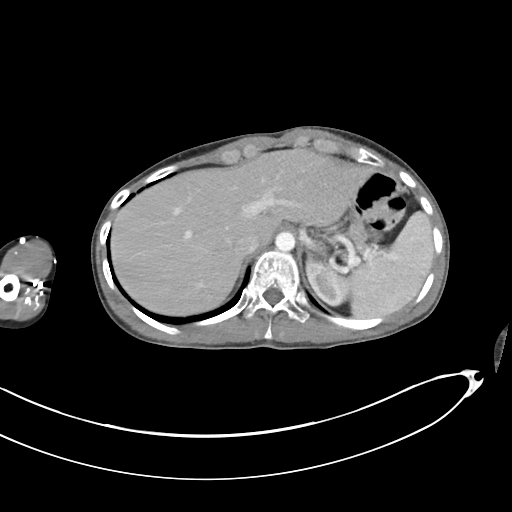
[im 87/100  soft-tissue]
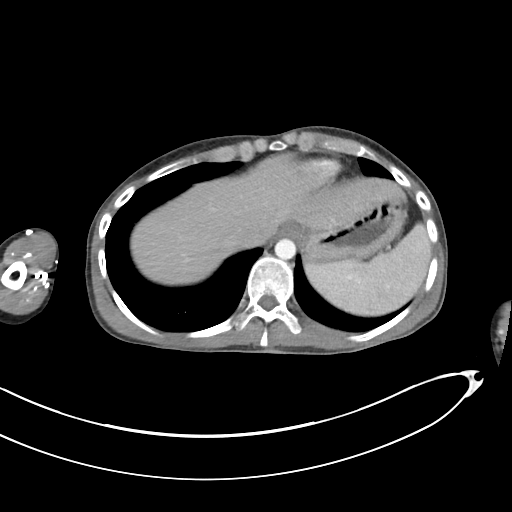
[im 95/100  soft-tissue]
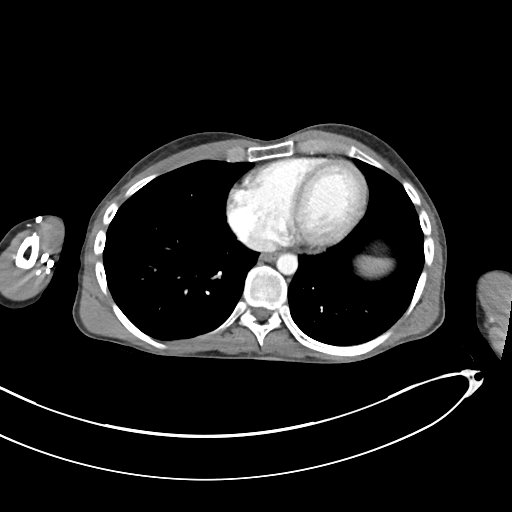

[Series 6: coronal soft tissue · coronal · 0.81mm/px · 3 of 93 slices shown]
[im 31/93  soft-tissue]
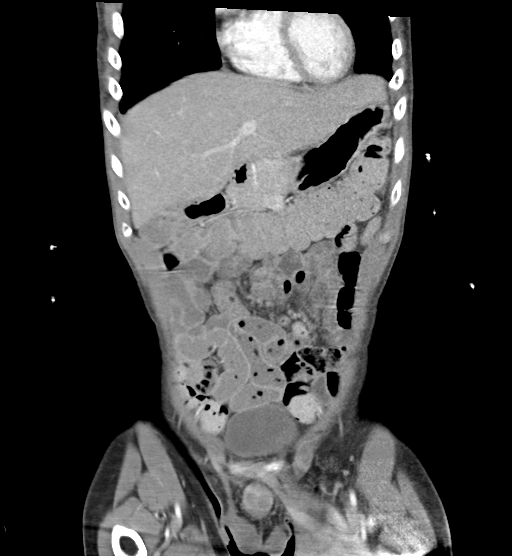
[im 41/93  soft-tissue]
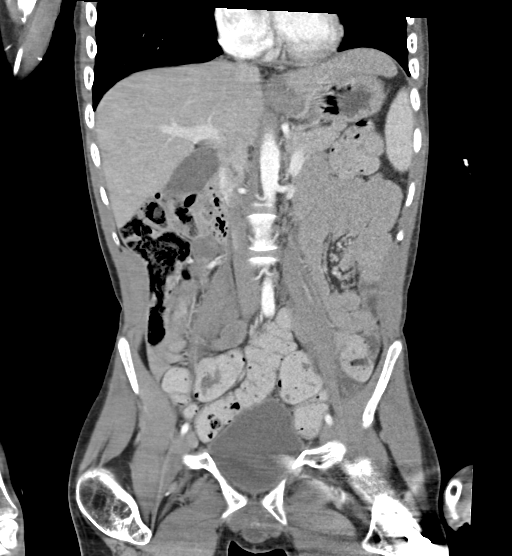
[im 52/93  soft-tissue]
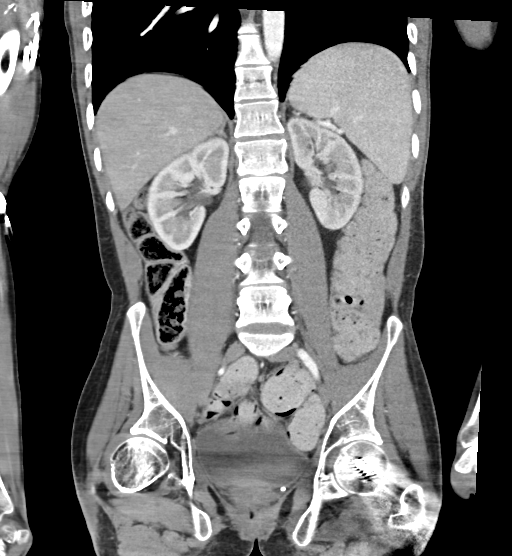

[17 of 46 positions shown; findings below may reference images not displayed]

RADIATION DOSE REDUCTION: This exam was performed according to the
departmental dose-optimization program which includes automated
exposure control, adjustment of the mA and/or kV according to
patient size and/or use of iterative reconstruction technique.

CONTRAST:  80mL OMNIPAQUE IOHEXOL 300 MG/ML  SOLN
FINDINGS: Lower chest: Visualized lung fields are clear. No pulmonary nodule
or pleural effusion.

Hepatobiliary: Hepatic parenchyma has normal density without a
discrete lesion. No intrahepatic biliary dilatation. Gallbladder is
unremarkable.

Pancreas: Normal

Spleen: Normal

Adrenals/Urinary Tract: Adrenals and kidneys are unremarkable. No
mass, hydroureteronephrosis or nephroureterolithiasis.

Stomach/Bowel: Stomach is unremarkable. Bowel-gas pattern is
nonobstructive. Moderate stool in the colon. No discrete mass seen.

Vascular/Lymphatic: Normal

Reproductive: Visualized prostate and the testicles have a normal
appearance.

Other: No free air, fluid, inflammatory changes, mass or significant
lymphadenopathy

Musculoskeletal: Compressive screw device at the left femoral neck.
IMPRESSION: Moderate stool in the colon. No free air, free fluid, inflammatory
changes, mass or significant lymphadenopathy in the abdomen or
pelvis.

## 2023-07-07 ENCOUNTER — Telehealth: Payer: Self-pay | Admitting: Adult Health

## 2023-07-07 MED ORDER — LAMOTRIGINE 150 MG PO TABS: 150.0000 mg | ORAL_TABLET | Freq: Two times a day (BID) | ORAL | 1 refills | Status: DC

## 2023-07-07 NOTE — Telephone Encounter (Signed)
 6 months refills sent, pt just seen 06/30/23.

## 2023-07-07 NOTE — Telephone Encounter (Signed)
 Pt's mother, Kent Peterson request refill for amoTRIgine (LAMICTAL) 150 MG tablet send to [facility] [phone]

## 2023-09-16 ENCOUNTER — Ambulatory Visit (HOSPITAL_COMMUNITY)
Admission: RE | Admit: 2023-09-16 | Discharge: 2023-09-16 | Disposition: A | Discharge status: Home / Self Care | Source: Ambulatory Visit | Attending: Family Medicine | Admitting: Family Medicine

## 2023-09-16 ENCOUNTER — Other Ambulatory Visit (HOSPITAL_COMMUNITY): Payer: Self-pay | Admitting: Medical

## 2023-09-16 DIAGNOSIS — M25551 Pain in right hip: Secondary | ICD-10-CM | POA: Diagnosis present

## 2023-11-03 ENCOUNTER — Other Ambulatory Visit: Payer: Self-pay | Admitting: Adult Health

## 2023-12-24 ENCOUNTER — Other Ambulatory Visit: Payer: Self-pay | Admitting: Adult Health

## 2023-12-31 ENCOUNTER — Telehealth: Payer: Self-pay | Admitting: Adult Health

## 2023-12-31 MED ORDER — LAMOTRIGINE 150 MG PO TABS: 150.0000 mg | ORAL_TABLET | Freq: Two times a day (BID) | ORAL | 0 refills | Status: DC

## 2023-12-31 NOTE — Telephone Encounter (Signed)
 Refill sent. Next appt with Dr Gregg is 03/22/24.

## 2023-12-31 NOTE — Telephone Encounter (Signed)
 Pt is requesting a refill for lamoTRIgine  (LAMICTAL ) 150 MG tablet .  Pharmacy: [facility] [phone]

## 2024-01-13 ENCOUNTER — Emergency Department (HOSPITAL_COMMUNITY)

## 2024-01-13 ENCOUNTER — Other Ambulatory Visit: Payer: Self-pay

## 2024-01-13 ENCOUNTER — Encounter (HOSPITAL_COMMUNITY): Payer: Self-pay

## 2024-01-13 ENCOUNTER — Inpatient Hospital Stay (HOSPITAL_COMMUNITY)
Admission: EM | Admit: 2024-01-13 | Discharge: 2024-01-17 | DRG: 871 | Disposition: A | Discharge status: Home / Self Care

## 2024-01-13 DIAGNOSIS — R569 Unspecified convulsions: Secondary | ICD-10-CM | POA: Diagnosis not present

## 2024-01-13 DIAGNOSIS — Z833 Family history of diabetes mellitus: Secondary | ICD-10-CM | POA: Diagnosis not present

## 2024-01-13 DIAGNOSIS — R636 Underweight: Secondary | ICD-10-CM | POA: Diagnosis present

## 2024-01-13 DIAGNOSIS — R531 Weakness: Secondary | ICD-10-CM

## 2024-01-13 DIAGNOSIS — K5900 Constipation, unspecified: Secondary | ICD-10-CM | POA: Diagnosis present

## 2024-01-13 DIAGNOSIS — G40909 Epilepsy, unspecified, not intractable, without status epilepticus: Secondary | ICD-10-CM | POA: Diagnosis present

## 2024-01-13 DIAGNOSIS — F919 Conduct disorder, unspecified: Secondary | ICD-10-CM | POA: Diagnosis present

## 2024-01-13 DIAGNOSIS — J690 Pneumonitis due to inhalation of food and vomit: Secondary | ICD-10-CM | POA: Diagnosis present

## 2024-01-13 DIAGNOSIS — I440 Atrioventricular block, first degree: Secondary | ICD-10-CM | POA: Diagnosis present

## 2024-01-13 DIAGNOSIS — R040 Epistaxis: Secondary | ICD-10-CM | POA: Diagnosis not present

## 2024-01-13 DIAGNOSIS — Z8249 Family history of ischemic heart disease and other diseases of the circulatory system: Secondary | ICD-10-CM | POA: Diagnosis not present

## 2024-01-13 DIAGNOSIS — K219 Gastro-esophageal reflux disease without esophagitis: Secondary | ICD-10-CM | POA: Diagnosis present

## 2024-01-13 DIAGNOSIS — E872 Acidosis, unspecified: Secondary | ICD-10-CM | POA: Diagnosis present

## 2024-01-13 DIAGNOSIS — R6520 Severe sepsis without septic shock: Secondary | ICD-10-CM | POA: Diagnosis present

## 2024-01-13 DIAGNOSIS — R339 Retention of urine, unspecified: Secondary | ICD-10-CM | POA: Diagnosis not present

## 2024-01-13 DIAGNOSIS — J189 Pneumonia, unspecified organism: Secondary | ICD-10-CM | POA: Diagnosis present

## 2024-01-13 DIAGNOSIS — A419 Sepsis, unspecified organism: Secondary | ICD-10-CM | POA: Diagnosis present

## 2024-01-13 DIAGNOSIS — Z681 Body mass index (BMI) 19 or less, adult: Secondary | ICD-10-CM

## 2024-01-13 DIAGNOSIS — G9349 Other encephalopathy: Secondary | ICD-10-CM | POA: Diagnosis present

## 2024-01-13 DIAGNOSIS — G039 Meningitis, unspecified: Secondary | ICD-10-CM | POA: Diagnosis not present

## 2024-01-13 DIAGNOSIS — Z79899 Other long term (current) drug therapy: Secondary | ICD-10-CM | POA: Diagnosis not present

## 2024-01-13 DIAGNOSIS — Z789 Other specified health status: Secondary | ICD-10-CM

## 2024-01-13 DIAGNOSIS — T68XXXA Hypothermia, initial encounter: Secondary | ICD-10-CM | POA: Diagnosis present

## 2024-01-13 DIAGNOSIS — Z91040 Latex allergy status: Secondary | ICD-10-CM

## 2024-01-13 DIAGNOSIS — Z8661 Personal history of infections of the central nervous system: Secondary | ICD-10-CM | POA: Diagnosis not present

## 2024-01-13 LAB — CBC WITH DIFFERENTIAL/PLATELET
Abs Immature Granulocytes: 0.01 K/uL (ref 0.00–0.07)
Basophils Absolute: 0 K/uL (ref 0.0–0.1)
Basophils Relative: 0 %
Eosinophils Absolute: 0.1 K/uL (ref 0.0–0.5)
Eosinophils Relative: 3 %
HCT: 37 % — ABNORMAL LOW (ref 39.0–52.0)
Hemoglobin: 12.5 g/dL — ABNORMAL LOW (ref 13.0–17.0)
Immature Granulocytes: 0 %
Lymphocytes Relative: 20 %
Lymphs Abs: 0.7 K/uL (ref 0.7–4.0)
MCH: 34.8 pg — ABNORMAL HIGH (ref 26.0–34.0)
MCHC: 33.8 g/dL (ref 30.0–36.0)
MCV: 103.1 fL — ABNORMAL HIGH (ref 80.0–100.0)
Monocytes Absolute: 0.3 K/uL (ref 0.1–1.0)
Monocytes Relative: 8 %
Neutro Abs: 2.3 K/uL (ref 1.7–7.7)
Neutrophils Relative %: 69 %
Platelets: 128 K/uL — ABNORMAL LOW (ref 150–400)
RBC: 3.59 MIL/uL — ABNORMAL LOW (ref 4.22–5.81)
RDW: 13.6 % (ref 11.5–15.5)
WBC: 3.3 K/uL — ABNORMAL LOW (ref 4.0–10.5)
nRBC: 0 % (ref 0.0–0.2)

## 2024-01-13 LAB — I-STAT CHEM 8, ED
BUN: 35 mg/dL — ABNORMAL HIGH (ref 6–20)
Calcium, Ion: 1.25 mmol/L (ref 1.15–1.40)
Chloride: 104 mmol/L (ref 98–111)
Creatinine, Ser: 0.9 mg/dL (ref 0.61–1.24)
Glucose, Bld: 75 mg/dL (ref 70–99)
HCT: 37 % — ABNORMAL LOW (ref 39.0–52.0)
Hemoglobin: 12.6 g/dL — ABNORMAL LOW (ref 13.0–17.0)
Potassium: 5.2 mmol/L — ABNORMAL HIGH (ref 3.5–5.1)
Sodium: 145 mmol/L (ref 135–145)
TCO2: 33 mmol/L — ABNORMAL HIGH (ref 22–32)

## 2024-01-13 LAB — URINALYSIS, ROUTINE W REFLEX MICROSCOPIC
Bilirubin Urine: NEGATIVE
Glucose, UA: NEGATIVE mg/dL
Hgb urine dipstick: NEGATIVE
Ketones, ur: NEGATIVE mg/dL
Leukocytes,Ua: NEGATIVE
Nitrite: NEGATIVE
Protein, ur: NEGATIVE mg/dL
Specific Gravity, Urine: 1.02 (ref 1.005–1.030)
pH: 7 (ref 5.0–8.0)

## 2024-01-13 LAB — RESP PANEL BY RT-PCR (RSV, FLU A&B, COVID)  RVPGX2
Influenza A by PCR: NEGATIVE
Influenza B by PCR: NEGATIVE
Resp Syncytial Virus by PCR: NEGATIVE
SARS Coronavirus 2 by RT PCR: NEGATIVE

## 2024-01-13 LAB — HEPATIC FUNCTION PANEL
ALT: 57 U/L — ABNORMAL HIGH (ref 0–44)
AST: 56 U/L — ABNORMAL HIGH (ref 15–41)
Albumin: 3.6 g/dL (ref 3.5–5.0)
Alkaline Phosphatase: 136 U/L — ABNORMAL HIGH (ref 38–126)
Bilirubin, Direct: <0.1 mg/dL (ref 0.0–0.2)
Total Bilirubin: 0.5 mg/dL (ref 0.0–1.2)
Total Protein: 6.7 g/dL (ref 6.5–8.1)

## 2024-01-13 LAB — LIPASE, BLOOD: Lipase: 77 U/L — ABNORMAL HIGH (ref 11–51)

## 2024-01-13 LAB — LACTIC ACID, PLASMA: Lactic Acid, Venous: 7 mmol/L (ref 0.5–1.9)

## 2024-01-13 LAB — BASIC METABOLIC PANEL WITH GFR
Anion gap: 6 (ref 5–15)
BUN: 27 mg/dL — ABNORMAL HIGH (ref 6–20)
CO2: 30 mmol/L (ref 22–32)
Calcium: 8.7 mg/dL — ABNORMAL LOW (ref 8.9–10.3)
Chloride: 107 mmol/L (ref 98–111)
Creatinine, Ser: 0.75 mg/dL (ref 0.61–1.24)
GFR, Estimated: >60 mL/min (ref >60)
Glucose, Bld: 70 mg/dL (ref 70–99)
Potassium: 4.9 mmol/L (ref 3.5–5.1)
Sodium: 143 mmol/L (ref 135–145)

## 2024-01-13 LAB — CARBAMAZEPINE LEVEL, TOTAL: Carbamazepine Lvl: 11.4 ug/mL (ref 4.0–12.0)

## 2024-01-13 LAB — I-STAT CG4 LACTIC ACID, ED: Lactic Acid, Venous: 0.3 mmol/L — ABNORMAL LOW (ref 0.5–1.9)

## 2024-01-13 LAB — MAGNESIUM: Magnesium: 2.2 mg/dL (ref 1.7–2.4)

## 2024-01-13 LAB — TSH: TSH: 2.699 u[IU]/mL (ref 0.350–4.500)

## 2024-01-13 LAB — TROPONIN I (HIGH SENSITIVITY): Troponin I (High Sensitivity): 4 ng/L (ref <18)

## 2024-01-13 LAB — T4, FREE: Free T4: 0.5 ng/dL — ABNORMAL LOW (ref 0.61–1.12)

## 2024-01-13 MED ORDER — RISPERIDONE 2 MG PO TABS
2.0000 mg | ORAL_TABLET | Freq: Two times a day (BID) | ORAL | Status: DC
  Administered 2024-01-14 – 2024-01-17 (×7): 2 mg via ORAL
  Filled 2024-01-13 (×10): qty 1

## 2024-01-13 MED ORDER — LACTATED RINGERS IV BOLUS
1000.0000 mL | Freq: Once | INTRAVENOUS | Status: AC
  Administered 2024-01-13: 1000 mL via INTRAVENOUS

## 2024-01-13 MED ORDER — CARBAMAZEPINE 200 MG PO TABS: 500.0000 mg | ORAL_TABLET | Freq: Two times a day (BID) | ORAL | Status: DC

## 2024-01-13 MED ORDER — LACTATED RINGERS IV SOLN: INTRAVENOUS | Status: DC

## 2024-01-13 MED ORDER — PANTOPRAZOLE SODIUM 40 MG PO TBEC
40.0000 mg | DELAYED_RELEASE_TABLET | Freq: Every day | ORAL | Status: DC
  Administered 2024-01-14 – 2024-01-17 (×4): 40 mg via ORAL
  Filled 2024-01-13 (×5): qty 1

## 2024-01-13 MED ORDER — METRONIDAZOLE 500 MG/100ML IV SOLN
500.0000 mg | Freq: Once | INTRAVENOUS | Status: AC
  Administered 2024-01-13: 500 mg via INTRAVENOUS
  Filled 2024-01-13: qty 100

## 2024-01-13 MED ORDER — ACETAMINOPHEN 650 MG RE SUPP
650.0000 mg | Freq: Four times a day (QID) | RECTAL | Status: DC | PRN
Start: 2024-01-13 — End: 2024-01-17

## 2024-01-13 MED ORDER — LORAZEPAM 2 MG/ML IJ SOLN
1.0000 mg | Freq: Once | INTRAMUSCULAR | Status: AC
  Administered 2024-01-13: 1 mg via INTRAVENOUS
  Filled 2024-01-13: qty 1

## 2024-01-13 MED ORDER — LACTATED RINGERS IV BOLUS (SEPSIS)
500.0000 mL | Freq: Once | INTRAVENOUS | Status: AC
  Administered 2024-01-13: 500 mL via INTRAVENOUS

## 2024-01-13 MED ORDER — LORAZEPAM 2 MG/ML IJ SOLN: 1.0000 mg | Freq: Four times a day (QID) | INTRAMUSCULAR | Status: DC | PRN

## 2024-01-13 MED ORDER — CARBAMAZEPINE ER 200 MG PO TB12
500.0000 mg | ORAL_TABLET | Freq: Two times a day (BID) | ORAL | Status: DC
Start: 2024-01-13 — End: 2024-01-17
  Administered 2024-01-14 – 2024-01-17 (×7): 500 mg via ORAL
  Filled 2024-01-13 (×12): qty 1

## 2024-01-13 MED ORDER — POLYETHYLENE GLYCOL 3350 17 G PO PACK
17.0000 g | PACK | Freq: Every day | ORAL | Status: DC
  Administered 2024-01-15 – 2024-01-17 (×2): 17 g via ORAL
  Filled 2024-01-13 (×4): qty 1

## 2024-01-13 MED ORDER — SODIUM CHLORIDE 0.9 % IV SOLN
2.0000 g | Freq: Once | INTRAVENOUS | Status: AC
  Administered 2024-01-13: 2 g via INTRAVENOUS
  Filled 2024-01-13: qty 12.5

## 2024-01-13 MED ORDER — LAMOTRIGINE 100 MG PO TABS
150.0000 mg | ORAL_TABLET | Freq: Two times a day (BID) | ORAL | Status: DC
  Administered 2024-01-14 – 2024-01-17 (×7): 150 mg via ORAL
  Filled 2024-01-13 (×8): qty 2

## 2024-01-13 MED ORDER — VANCOMYCIN HCL IN DEXTROSE 1-5 GM/200ML-% IV SOLN
1000.0000 mg | Freq: Once | INTRAVENOUS | Status: AC
  Administered 2024-01-13: 1000 mg via INTRAVENOUS
  Filled 2024-01-13: qty 200

## 2024-01-13 MED ORDER — ACETAMINOPHEN 325 MG PO TABS: 650.0000 mg | ORAL_TABLET | Freq: Four times a day (QID) | ORAL | Status: DC | PRN

## 2024-01-13 MED ORDER — CARBAMAZEPINE ER 200 MG PO CP12
500.0000 mg | ORAL_CAPSULE | Freq: Two times a day (BID) | ORAL | Status: DC
  Filled 2024-01-13: qty 1

## 2024-01-13 MED ORDER — ENOXAPARIN SODIUM 40 MG/0.4ML IJ SOSY
40.0000 mg | PREFILLED_SYRINGE | INTRAMUSCULAR | Status: DC
  Administered 2024-01-13 – 2024-01-16 (×4): 40 mg via SUBCUTANEOUS
  Filled 2024-01-13 (×4): qty 0.4

## 2024-01-13 NOTE — Sepsis Progress Note (Signed)
 Code Sepsis protocol being monitored by eLink.

## 2024-01-13 NOTE — Hospital Course (Addendum)
 Kent Peterson is a 33 y.o. male with PMH of chronic static encephalopathy, epilepsy admitted to the [facility] family medicine teaching service for generalized weakness and seizure initially meeting sepsis criteria thought to be secondary to aspiration pneumonia.  His hospital course is detailed below:  Sepsis Aspiration Pneumonia Mother brought patient in after noticing increased lethargy and weakness.  Seizure noted in the ED, that resolved with Ativan  administration, despite years of no seizures with antiepileptic medications.  Patient hypothermic and bradycardic unclear source of infection. In the ED, IV Flagyl  (10/21) and Cefepime  (10/21) administered, Cefepime  discontinued given history of epilepsy. IV vancomycin  (10/21-10/23, acyclovir (10/22-10/23), Ceftriaxone  (10/22-10/23) for empiric meningitis coverage.  Neurology consulted, and performed lumbar puncture. CSF and blood cultures without growth.  Acyclovir was discontinued. Encephalopathy labs collected and negative though Vitamin B1 is pending at the time of discharge. Will update if they return positive. Initial presentation likely from aspiration pneumonia, no evidence of meningitis. Patient returned back to his baseline at time of discharge.  Seizure, Epilepsy 1 seizure in the ED.  Resolved with Ativan .  Neurology ordered 1000 mg Keppra one-time.  Patient was resumed back on his home meds and had no more seizures this admission.  EEG was done this admission which showed encephalopathy but no seizure or epileptiform discharges.  MRI brain was done on this admission as well which showed no acute intracranial abnormality.  Urinary Retention Patient found to be retaining urine overnight 10/24. Bladder scan showed 358 mL. Completed I&O x2 with eventual foley placement. Patient was discharged with foley in place and recommendation to follow up with urology outpatient for a void trial.   Other chronic conditions were medically managed with  home medications and formulary alternatives as necessary (GERD, constipation, behavioral concerns)  PCP Follow-up recommendations: - Ensure follow up with Neurology - Referral to Urology for void trial

## 2024-01-13 NOTE — ED Notes (Addendum)
 Pt HR sustained in the 130s. RN reported to bedside to assess. Pt was actively having a seizure. Zheng MD was at bedside, stated seizure had lasted about 4-81minutes, Zheng MD reported no new orders needed at this time.

## 2024-01-13 NOTE — ED Notes (Signed)
 Non-rebreather applied. Pt sats 90%. Notified admitting team. Per Elicia MD, notify if pt sats drop below 90% or if he seizes again.

## 2024-01-13 NOTE — Assessment & Plan Note (Signed)
 GERD - Protonix  40mg  daily Constipation - Miralax  17g daily Behavioral concerns - Risperdal  2mg  BID

## 2024-01-13 NOTE — ED Notes (Addendum)
 Notified Zheng MD, pt temp 101.1, removed bearhugger. Pt sats also sustaining 89% on 6L [place].

## 2024-01-13 NOTE — Assessment & Plan Note (Signed)
 Admission - Admit to progressive unit, Attending Dr. Suzann Daring, FMTS - Diet: NPO pending SLP consult - Tylenol  650mg  PRN for pain management - Start Vancomycyn and zosyn for empiric coverage - s/p Vanc, cefepime , flagyl  in ED. Will avoid cefepime  for concern of lowering seizure threshold.  - monitor fever curve - Bair Hugger as needed for hypothermia - Bladder scan q8h Labs  - Consider LP if persistently febrile  - Procalcitonin to rule out bacterial infection  - VBG

## 2024-01-13 NOTE — ED Notes (Signed)
 Pt is not in a state for safe PO med admin. Pt mother is bedside and admits that pt is less than 50% baseline and unable to take PO meds. Care team RN notified.

## 2024-01-13 NOTE — ED Notes (Signed)
 Patient is unable to provide urine sample. Will need a in and out cath to obtain the UA.

## 2024-01-13 NOTE — ED Triage Notes (Signed)
 Patient here from PCP office after they sent him for hypotension. Patients mother states that his blood pressure was low on Saturday. Mother reports that he has a wound on his left ankle but denies redness and drainage. She does report the ankle is swollen. She reports that the ankle was infected about 3-4 weeks ago and was given antibiotics to treat. Denies fevers. Patient is non-verbal at baseline, mother states he is usually alert and looking around. He is lethargic and decreased appetite over the past few days as well.

## 2024-01-13 NOTE — ED Notes (Signed)
 RN reported back to bedside, pt O2 sats 83%, [place] applied w/ improvement, pt having another seizure, 1mg  of Ativan  ordered by Elicia MD.

## 2024-01-13 NOTE — Assessment & Plan Note (Addendum)
 Had breakthrough seizure in ED, resolved with 1mg  ativan  IV.  - Restart home meds  - Carbamazepine  500mg  BID  - Lamictal  150mg  BID - Ativan  1mg  PRN for seizures lasting >5 minutes.   - EEG - Consult Neurology, pending EEG  - NPO and SLP eval - CK to check for rhabdo given seizures - LR 150mg /hr given NPO statu

## 2024-01-13 NOTE — ED Notes (Signed)
 CCMD called.

## 2024-01-13 NOTE — ED Provider Notes (Signed)
 [place] EMERGENCY DEPARTMENT AT [facility] Provider Note   CSN: [id number] Arrival date & time: 01/13/24  1219     Patient presents with: Hypotension   Kent Peterson is a 33 y.o. male.   33 year old male is brought in by his mom and caregiver for concern of hypotension.  He is also been somnolent since Saturday.  When he is awake he eats and drinks and is normal but otherwise he is decreased in terms of his energy.  No cough, complaints of abdominal pain or anything else that mom or caregiver has noticed to point towards a source of infection.  The history is provided by the patient. No language interpreter was used.       Prior to Admission medications   Medication Sig Start Date End Date Taking? Authorizing Provider  carbamazepine  (CARBATROL ) 100 MG 12 hr capsule Take 1 capsule (100 mg total) by mouth 2 (two) times daily. 06/30/23   Millikan, Megan, NP  carbamazepine  (CARBATROL ) 200 MG 12 hr capsule Take 2 capsules (400 mg total) by mouth 2 (two) times daily. 06/30/23   Sherryl Bouchard, NP  ferrous sulfate  325 (65 FE) MG tablet Take 1 tablet (325 mg total) by mouth every other day. Patient taking differently: Take 325 mg by mouth every other day. Taking MWSat 02/23/23   Perri DELENA Meliton Mickey., MD  lamoTRIgine  (LAMICTAL ) 150 MG tablet Take 1 tablet (150 mg total) by mouth 2 (two) times daily. 12/31/23   Millikan, Megan, NP  loratadine (CLARITIN) 10 MG tablet Take 10 mg by mouth daily as needed for allergies.    [provider]  LORazepam  (ATIVAN ) 1 MG tablet TAKE 1 TABLET BY MOUTH AT BEDTIME AS NEEDED FOR ANXIETY Patient taking differently: Take 1 mg by mouth at bedtime as needed for anxiety. 11/23/18   Jenel Carlin POUR, MD  magnesium gluconate (MAGONATE) 500 MG tablet Take 500 mg by mouth daily.    [provider]  mupirocin ointment (BACTROBAN) 2 % Apply 1 Application topically 3 (three) times daily.    [provider]  pantoprazole   (PROTONIX ) 40 MG tablet Take 40 mg by mouth daily. 05/30/21   [provider]  polyethylene glycol (MIRALAX ) 17 g packet Take 17 g by mouth daily. 02/23/23   Perri DELENA Meliton Mickey., MD  Prenatal Vit-Fe Fumarate-FA (PRENATAL PO) Take 1 tablet by mouth daily. Unknown brand/dosage    [provider]  risperiDONE  (RISPERDAL ) 2 MG tablet Take 2 mg by mouth 2 (two) times daily.    [provider]    Allergies: Latex    Review of Systems  Unable to perform ROS: Patient nonverbal  Constitutional:  Positive for activity change (Somnolent) and fatigue. Negative for chills and fever.  Respiratory:  Negative for shortness of breath.   Cardiovascular:  Negative for chest pain and palpitations.  Gastrointestinal:  Negative for abdominal pain, nausea and vomiting.  Neurological:  Negative for light-headedness.    Updated Vital Signs BP 97/70 (BP Location: Left Arm)   Pulse 60   Resp 16   Ht 5' 7 (1.702 m)   Wt 45.4 kg   SpO2 97%   BMI 15.66 kg/m   Physical Exam Vitals and nursing note reviewed.  Constitutional:      General: He is not in acute distress.    Appearance: Normal appearance. He is not ill-appearing.  HENT:     Head: Normocephalic and atraumatic.     Nose: Nose normal.  Eyes:  Conjunctiva/sclera: Conjunctivae normal.  Cardiovascular:     Rate and Rhythm: Normal rate and regular rhythm.  Pulmonary:     Effort: Pulmonary effort is normal. No respiratory distress.  Abdominal:     General: There is no distension.     Tenderness: There is no abdominal tenderness. There is no guarding.  Musculoskeletal:        General: No deformity. Normal range of motion.     Cervical back: Normal range of motion.  Skin:    Findings: No rash.  Neurological:     Mental Status: He is alert.     (all labs ordered are listed, but only abnormal results are displayed) Labs Reviewed  CBC WITH DIFFERENTIAL/PLATELET - Abnormal; Notable for the following components:       Result Value   WBC 3.3 (*)    RBC 3.59 (*)    Hemoglobin 12.5 (*)    HCT 37.0 (*)    MCV 103.1 (*)    MCH 34.8 (*)    Platelets 128 (*)    All other components within normal limits  HEPATIC FUNCTION PANEL - Abnormal; Notable for the following components:   AST 56 (*)    ALT 57 (*)    Alkaline Phosphatase 136 (*)    All other components within normal limits  I-STAT CHEM 8, ED - Abnormal; Notable for the following components:   Potassium 5.2 (*)    BUN 35 (*)    TCO2 33 (*)    Hemoglobin 12.6 (*)    HCT 37.0 (*)    All other components within normal limits  I-STAT CG4 LACTIC ACID, ED - Abnormal; Notable for the following components:   Lactic Acid, Venous 0.3 (*)    All other components within normal limits  RESP PANEL BY RT-PCR (RSV, FLU A&B, COVID)  RVPGX2  URINALYSIS, ROUTINE W REFLEX MICROSCOPIC  BASIC METABOLIC PANEL WITH GFR  MAGNESIUM    EKG: None  Radiology: DG Chest 2 View Result Date: 01/13/2024 EXAM: 2 VIEW(S) XRAY OF THE CHEST 01/13/2024 01:20:00 PM COMPARISON: 02/20/2023 CLINICAL HISTORY: lethargy, non-verbal. Reason for exam: lethargy, non-verbal; Per triage notes: Pts caregiver reports that they were sent from his PCP due to hypotension. Pt is unable to communicate which is pts baseline per caregiver. FINDINGS: LUNGS AND PLEURA: No focal pulmonary opacity. No pulmonary edema. No pleural effusion. No pneumothorax. HEART AND MEDIASTINUM: No acute abnormality of the cardiac and mediastinal silhouettes. BONES AND SOFT TISSUES: No acute osseous abnormality. IMPRESSION: 1. No acute cardiopulmonary process. Electronically signed by: Waddell Calk MD 01/13/2024 02:58 PM EDT RP Workstation: [id number]     Procedures   Medications Ordered in the ED  lactated ringers  bolus 1,000 mL (has no administration in time range)                                    Medical Decision Making Amount and/or Complexity of Data Reviewed Labs: ordered. Radiology:  ordered.   Medical Decision Making / ED Course   This patient presents to the ED for concern of somnolence, hypotension, this involves an extensive number of treatment options, and is a complaint that carries with it a high risk of complications and morbidity.  The differential diagnosis includes infection, dehydration  MDM: 33 year old male presents with mom and caregiver for concern of soft blood pressure.  He has been somnolent since Saturday.  No infectious symptoms or other complaints that  mom or caregiver has noticed during this timeframe. He is alert during my interview.  No abdominal tenderness.  CBC without leukocytosis or significant anemia.  Without lactic acidosis.  Hepatic function panel minimal elevations in LFTs.  Will add on lipase.  Respiratory panel pending, chest x-ray without acute cardiopulmonary process.  Will give a LR bolus. UA pending.  Potassium of 5.2 but this is on a Chem-8.  Will place a BMP ordered.   Additional history obtained: -Additional history obtained from mom and caregiver at bedside -External records from outside source obtained and reviewed including: Chart review including previous notes, labs, imaging, consultation notes   Lab Tests: -I ordered, reviewed, and interpreted labs.   The pertinent results include:   Labs Reviewed  CBC WITH DIFFERENTIAL/PLATELET - Abnormal; Notable for the following components:      Result Value   WBC 3.3 (*)    RBC 3.59 (*)    Hemoglobin 12.5 (*)    HCT 37.0 (*)    MCV 103.1 (*)    MCH 34.8 (*)    Platelets 128 (*)    All other components within normal limits  HEPATIC FUNCTION PANEL - Abnormal; Notable for the following components:   AST 56 (*)    ALT 57 (*)    Alkaline Phosphatase 136 (*)    All other components within normal limits  I-STAT CHEM 8, ED - Abnormal; Notable for the following components:   Potassium 5.2 (*)    BUN 35 (*)    TCO2 33 (*)    Hemoglobin 12.6 (*)    HCT 37.0 (*)    All  other components within normal limits  I-STAT CG4 LACTIC ACID, ED - Abnormal; Notable for the following components:   Lactic Acid, Venous 0.3 (*)    All other components within normal limits  RESP PANEL BY RT-PCR (RSV, FLU A&B, COVID)  RVPGX2  URINALYSIS, ROUTINE W REFLEX MICROSCOPIC  BASIC METABOLIC PANEL WITH GFR  MAGNESIUM      EKG  EKG Interpretation Date/Time:    Ventricular Rate:    PR Interval:    QRS Duration:    QT Interval:    QTC Calculation:   R Axis:      Text Interpretation:           Imaging Studies ordered: I ordered imaging studies including chest x-ray, CT head CT had not resulted at the end of my shift I independently visualized and interpreted imaging. 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  Final diagnoses:  None    ED Discharge Orders     None          Hildegard Loge, PA-C 01/13/24 1520    Garrick Charleston, MD 01/14/24 [id number]

## 2024-01-13 NOTE — ED Provider Triage Note (Addendum)
 Emergency Medicine Provider Triage Evaluation Note  Kent Peterson , a 33 y.o. male  was evaluated in triage.  Patient brought in by caregivers at bedside for concern of sleeping more than normal over the past 4 days.  He also seems to have a lower blood pressure than normal.  Caregivers deny any cough, vomiting, fevers at home.  He has been noted to have a pressure sore in the left ankle, but this has been slowly healing.  Review of Systems  Positive: As above Negative: As above  Physical Exam  BP 97/70 (BP Location: Left Arm)   Pulse 60   Resp 16   Ht 5' 7 (1.702 m)   Wt 45.4 kg   SpO2 97%   BMI 15.66 kg/m  Gen:   Sleeping, but able to be woken Resp:  Normal effort  MSK:   Moves extremities without difficulty  Other:  Abdomen is soft non tender  Left ankle with mild pressure ulcer, appears to be healing.  No purulent drainage.  Medical Decision Making  Medically screening exam initiated at 12:58 PM.  Appropriate orders placed.  Kent Peterson was informed that the remainder of the evaluation will be completed by another provider, this initial triage assessment does not replace that evaluation, and the importance of remaining in the ED until their evaluation is complete.  No vital sign abnormalities now to suggest sepsis   Kent Palma, PA-C 01/13/24 1257    Kent Palma, PA-C 01/13/24 1258

## 2024-01-13 NOTE — H&P (Cosign Needed Addendum)
 Hospital Admission History and Physical Service Pager: [phone]  Patient name: Kent Peterson Medical record number: [id number] Date of Birth: Feb 26, 1991 Age: 33 y.o. Gender: male  Primary Care Provider: Dr. Charlie Reas, MD Consultants: none Code Status: Full  Preferred Emergency Contact:   Contact Information     Name Relation Home Work Mobile   Kent Peterson,Kent Peterson Mother [phone]  [phone]   Kent Peterson,Kent Peterson Father   [phone]      Other Contacts   None on File      Chief Complaint: Generalized Weakness  Differential and Medical Decision Making:  Kent Peterson is a 33 y.o. male presenting with generalized weakness who initially met SIRS criteria with hypothermia and bradycardia, who is having breakthrough seizures. PMHx significant for chronic state encephalopathy since infancy, seizures, requires assistance with all ADLs at baseline. High suspicion that this is from an infectious cause, though no source has been identified. Suspect that infection has lowered the seizure threshold which is leading to breakthrough seizures. Pt has been adherent to medications. TSH wnl. CBC and Lactic acid not concerning (7.0 after seizures). Transaminases minimally elevated. Lipase minimally elevated.  Differential for potenialt infectious sources Viral infection - most likely etiology given lack of other clear infectious etiologies and relatively benign laboratory findings. Meningitis - Can not rule out, hesitant to perform LP at this time with associated risks and mild symptoms Septic Arthritis - Knees are red, but this is likely from fall earlier today, and symptoms started 3 days prior to admission. No effusion or warmth noted on exam.  Assessment & Plan Generalized weakness Hypothermia Chronic static encephalopathy Admission - Admit to progressive unit, Attending Dr. Suzann Daring, FMTS - Diet: NPO pending SLP consult - Tylenol  650mg  PRN for pain management - Bair  Hugger as needed for hypothermia Labs  - Consider LP if persistently febrile  - Procalcitonin to rule out bacterial infection  - VBG Seizure disorder (HCC) Had breakthrough seizure in ED, resolved with 1mg  ativan  IV.  - Restart home meds  - Carbamazepine  500mg  BID  - Lamictal  150mg  BID - Ativan  1mg  PRN for seizures lasting >5 minutes.   - EEG - Consider Neurology consult pending EEG results - NPO and SLP eval - CK to check for rhabdo given seizures - LR 150mg /hr given NPO status Chronic health problem GERD - Protonix  40mg  daily Constipation - Miralax  17g daily Behavioral concerns - Risperdal  2mg  BID  FEN/GI: NPO pending SLP  VTE Prophylaxis: Lovenox  Disposition: Progressive for seizure  History of Present Illness:  Kent Peterson is a 33 y.o. male presenting with generalized weakness. Mom noticed increased lethargy on Saturday with less appetite, and his legs gave out while having his teeth brushed.   He is dependent for all ADLs, including eating, since infancy after he had a viral infection. He lives with mom, dad, and caretaker who comes daily, and pet dogs who have been vaccinated. He has a long history of seizures, and is on Lamictal  and Carbamazepine  for their control. Prior to today, he had not had a seizure in years per mom. Has not had his antiepileptics this afternoon.  Risperidol for behavioral issues.  Denies any diarrhea, change in urianry habits or frequency, cough, vomiting. They bathe him and have not noticed any new rashes or sores.   Denies any sick contacts. Denies recent medication changes.  Review Of Systems: Per HPI with the following additions: mother denied him having any fevers, SOB, N/V/D  Pertinent Past Medical History: Admitted  for similar presentation in November 2024, inconclusive findings. Remainder reviewed in history tab.   Pertinent Past Surgical History:  Remainder reviewed in history tab.  Pertinent Social History: Tobacco use:  Yes/No/Former Lives with mom, dad, pet dogs. Caretaker comes daily. Mother also an active caretaker.  Pertinent Family History: N/a  Important Outpatient Medications: Antiepileptics - Carbamazepine  500mg  BID, Lamictal  150mg  BID Anxiety - Ativan  1mg  PRN (varies in frequency, a few times per month) Behavioral concerns - Risperdal  2mg  BID   Objective: BP (!) 108/59   Pulse (!) 124   Temp (!) 101.1 F (38.4 C) (Rectal)   Resp 17   Ht 5' 7 (1.702 m)   Wt 45.4 kg   SpO2 92%   BMI 15.66 kg/m   Physical Exam Vitals and nursing note reviewed. Exam conducted with a chaperone present.  Constitutional:      Appearance: He is not diaphoretic.  Cardiovascular:     Rate and Rhythm: Normal rate.     Heart sounds: No murmur heard. Pulmonary:     Effort: Pulmonary effort is normal. No respiratory distress.     Breath sounds: Normal breath sounds.  Abdominal:     General: Abdomen is flat. There is no distension.     Palpations: Abdomen is soft. There is no mass.  Musculoskeletal:        General: No swelling.     Right lower leg: No edema.     Left lower leg: No edema.     Comments: Erythema at both knees, mom reports from fall when brushing teeth. No edema, not warm. Not suspicious for septic arthritis.  Skin:    General: Skin is warm and dry.  Neurological:     Mental Status: He is alert. Mental status is at baseline.     Comments: Lethargic per mom, otherwise at baseline.    Witnessed seizures during assessment Pt had repetitive R eye winking and mouth jerking and irregular writhing of R arm. Eyes with R downward gaze. This lasted about 4 minutes, then subsides to just facial twitches; body movements have since resided. Facial twitching lasted for another 5 minutes, then subsides. (Total episode lasted 9 minutes)   Then about 3 minutes later, pt started to have full body seizrue again. Ativan  1 mg was administered IV. Seizure improved after ~49minutes after ativan  administration.    Labs:  CBC BMET  Recent Labs  Lab 01/13/24 1301 01/13/24 1308  WBC 3.3*  --   HGB 12.5* 12.6*  HCT 37.0* 37.0*  PLT 128*  --    Recent Labs  Lab 01/13/24 1437  NA 143  K 4.9  CL 107  CO2 30  BUN 27*  CREATININE 0.75  GLUCOSE 70  CALCIUM 8.7*     EKG: My own interpretation (not copied from electronic read): Sinus rhythm, normal rate.   Imaging Studies Performed:  Imaging Study (ie. Chest x-ray) CT Head Wo Contrast Result Date: 01/13/2024 EXAM: CT HEAD WITHOUT CONTRAST 01/13/2024 03:07:00 PM TECHNIQUE: CT of the head was performed without the administration of intravenous contrast. Automated exposure control, iterative reconstruction, and/or weight based adjustment of the mA/kV was utilized to reduce the radiation dose to as low as reasonably achievable. COMPARISON: Head CT 02/20/2023. CLINICAL HISTORY: Mental status change, unknown cause. Hypotension. Sleeping more than normal over the past 4 days. FINDINGS: BRAIN AND VENTRICLES: There is no evidence of an acute infarct, intracranial hemorrhage, mass, midline shift, hydrocephalus, or extra-axial fluid collection. Widespread patchy encephalomalacia is again seen throughout  both cerebral hemispheres including a 3.5 cm region of cystic encephalomalacia in the anterior right frontal lobe. There is associated ex vacuo dilatation of the frontal and occipital horns and atria of the lateral ventricles. ORBITS: No acute abnormality. SINUSES: Partially visualized mucosal thickening in the left maxillary sinus. Clear mastoid air cells. SOFT TISSUES AND SKULL: No acute soft tissue abnormality. No skull fracture. IMPRESSION: 1. No acute intracranial abnormality. 2. Unchanged extensive encephalomalacia in both cerebral hemispheres. Electronically signed by: Dasie Hamburg MD 01/13/2024 03:32 PM EDT RP Workstation: [id number]   DG Chest 2 View Result Date: 01/13/2024 EXAM: 2 VIEW(S) XRAY OF THE CHEST 01/13/2024 01:20:00 PM COMPARISON: 02/20/2023  CLINICAL HISTORY: lethargy, non-verbal. Reason for exam: lethargy, non-verbal; Per triage notes: Pts caregiver reports that they were sent from his PCP due to hypotension. Pt is unable to communicate which is pts baseline per caregiver. FINDINGS: LUNGS AND PLEURA: No focal pulmonary opacity. No pulmonary edema. No pleural effusion. No pneumothorax. HEART AND MEDIASTINUM: No acute abnormality of the cardiac and mediastinal silhouettes. BONES AND SOFT TISSUES: No acute osseous abnormality. IMPRESSION: 1. No acute cardiopulmonary process. Electronically signed by: Waddell Calk MD 01/13/2024 02:58 PM EDT RP Workstation: [id number]   My Interpretation: No acute cardiopulmonary abnormalities. No acute head CT findings.   Kent Nancyann NOVAK, DO 01/13/2024, 10:19 PM PGY-1, [facility]  FPTS Intern pager: [phone], text pages welcome Secure chat group [facility] Teaching Service

## 2024-01-13 NOTE — ED Triage Notes (Signed)
 Pts caregiver reports that they were sent from his PCP due to hypotension. Pt is unable to communicate which is pts baseline per caregiver.

## 2024-01-13 NOTE — ED Provider Notes (Addendum)
 Care assumed from [place], PA-C at shift change. Please see their note for further information.  Physical Exam  BP 97/70 (BP Location: Left Arm)   Pulse 60   Resp 16   Ht 5' 7 (1.702 m)   Wt 45.4 kg   SpO2 97%   BMI 15.66 kg/m   Physical Exam Vitals and nursing note reviewed.  Constitutional:      General: He is not in acute distress.    Appearance: Normal appearance. He is normal weight. He is not ill-appearing, toxic-appearing or diaphoretic.  HENT:     Head: Normocephalic and atraumatic.  Cardiovascular:     Rate and Rhythm: Normal rate.  Pulmonary:     Effort: Pulmonary effort is normal. No respiratory distress.  Abdominal:     General: Abdomen is flat.     Palpations: Abdomen is soft.     Tenderness: There is no abdominal tenderness.  Musculoskeletal:        General: Normal range of motion.     Cervical back: Normal range of motion.  Skin:    General: Skin is warm and dry.  Neurological:     General: No focal deficit present.     Mental Status: He is alert.  Psychiatric:        Mood and Affect: Mood normal.        Behavior: Behavior normal.     Procedures  .Critical Care  Performed by: Nora Lauraine LABOR, PA-C Authorized by: Liviana Mills A, PA-C   Critical care provider statement:    Critical care time (minutes):  35   Critical care was necessary to treat or prevent imminent or life-threatening deterioration of the following conditions:  Sepsis   Critical care was time spent personally by me on the following activities:  Development of treatment plan with patient or surrogate, discussions with primary provider, evaluation of patient's response to treatment, obtaining history from patient or surrogate, ordering and review of laboratory studies, ordering and review of radiographic studies, pulse oximetry, re-evaluation of patient's condition and review of old charts   Care discussed with: admitting provider     ED Course / MDM    Medical Decision Making Amount  and/or Complexity of Data Reviewed Labs: ordered. Radiology: ordered.  Risk Prescription drug management.   Briefly: Patient with history of chronic encephalopathy, nonverbal at baseline, presents with increased somnolence, hypotension, hypothermia. Labs so far reassuring, receiving fluids, temp not yet obtained. Plan to reassess after fluids.   Informed by nursing staff that patients temp is 91.6, placed on Bair hugger for same. Also notably bradycardic in the 40s, EKG reviewed by my attending Dr Tegeler who reports no STEMI, some ST changes, sinus rhythm. Troponin added. Thyroid studies also added.   Chart reviewed, looks like patient was seen for similar symptoms in November 2024, was admitted with suspected sepsis and no clear etiology was found.  Patients WBC 3.3 consistent with previous, hgb 12.5 consistent with previous, AST 56, ALT 57, Alk phos 13, Lipase 77, TSH WNL, T4 0.50. Blood cultures pending.   Given patient meets sepsis criteria, unclear source, broad spectrum antibiotics started. Blood pressure improved with 1.5 liters of fluid. Will require admission.   Discussed plan with patient and family at bedside who are understanding and in agreement.   Discussed patient with hospitalist Dr. Lera who accepts patient for admission.  Findings and plan of care discussed with supervising physician Dr. Ginger who is in agreement.      Kent Peterson  A, PA-C 01/13/24 1935    Alesi Zachery A, PA-C 01/13/24 1936    Tegeler, Lonni PARAS, MD 01/13/24 2329

## 2024-01-14 ENCOUNTER — Inpatient Hospital Stay (HOSPITAL_COMMUNITY)

## 2024-01-14 ENCOUNTER — Encounter (HOSPITAL_COMMUNITY): Payer: Self-pay | Admitting: Family Medicine

## 2024-01-14 DIAGNOSIS — G9349 Other encephalopathy: Secondary | ICD-10-CM | POA: Diagnosis not present

## 2024-01-14 DIAGNOSIS — R6520 Severe sepsis without septic shock: Secondary | ICD-10-CM

## 2024-01-14 DIAGNOSIS — A419 Sepsis, unspecified organism: Secondary | ICD-10-CM

## 2024-01-14 DIAGNOSIS — R569 Unspecified convulsions: Secondary | ICD-10-CM

## 2024-01-14 DIAGNOSIS — G40909 Epilepsy, unspecified, not intractable, without status epilepticus: Secondary | ICD-10-CM

## 2024-01-14 DIAGNOSIS — G039 Meningitis, unspecified: Secondary | ICD-10-CM | POA: Diagnosis not present

## 2024-01-14 DIAGNOSIS — R531 Weakness: Secondary | ICD-10-CM

## 2024-01-14 LAB — BASIC METABOLIC PANEL WITH GFR
Anion gap: 12 (ref 5–15)
BUN: 18 mg/dL (ref 6–20)
CO2: 27 mmol/L (ref 22–32)
Calcium: 9.1 mg/dL (ref 8.9–10.3)
Chloride: 103 mmol/L (ref 98–111)
Creatinine, Ser: 1.01 mg/dL (ref 0.61–1.24)
GFR, Estimated: >60 mL/min (ref >60)
Glucose, Bld: 87 mg/dL (ref 70–99)
Potassium: 4 mmol/L (ref 3.5–5.1)
Sodium: 142 mmol/L (ref 135–145)

## 2024-01-14 LAB — FOLATE: Folate: >20 ng/mL (ref >5.9)

## 2024-01-14 LAB — MENINGITIS/ENCEPHALITIS PANEL (CSF)

## 2024-01-14 LAB — PROCALCITONIN: Procalcitonin: <0.1 ng/mL

## 2024-01-14 LAB — CK: Total CK: 89 U/L (ref 49–397)

## 2024-01-14 LAB — CSF CELL COUNT WITH DIFFERENTIAL
RBC Count, CSF: 1 /mm3 — ABNORMAL HIGH
Tube #: 3
WBC, CSF: 0 /mm3 (ref 0–5)

## 2024-01-14 LAB — BLOOD GAS, VENOUS
Acid-Base Excess: 6.4 mmol/L — ABNORMAL HIGH (ref 0.0–2.0)
Bicarbonate: 33.5 mmol/L — ABNORMAL HIGH (ref 20.0–28.0)
O2 Saturation: 82.6 %
Patient temperature: 36.7
pCO2, Ven: 57 mmHg (ref 44–60)
pH, Ven: 7.37 (ref 7.25–7.43)
pO2, Ven: 50 mmHg — ABNORMAL HIGH (ref 32–45)

## 2024-01-14 LAB — CBC
HCT: 34.1 % — ABNORMAL LOW (ref 39.0–52.0)
Hemoglobin: 11.8 g/dL — ABNORMAL LOW (ref 13.0–17.0)
MCH: 35.1 pg — ABNORMAL HIGH (ref 26.0–34.0)
MCHC: 34.6 g/dL (ref 30.0–36.0)
MCV: 101.5 fL — ABNORMAL HIGH (ref 80.0–100.0)
Platelets: 130 K/uL — ABNORMAL LOW (ref 150–400)
RBC: 3.36 MIL/uL — ABNORMAL LOW (ref 4.22–5.81)
RDW: 13.2 % (ref 11.5–15.5)
WBC: 7.8 K/uL (ref 4.0–10.5)
nRBC: 0 % (ref 0.0–0.2)

## 2024-01-14 LAB — PROTEIN AND GLUCOSE, CSF
Glucose, CSF: 75 mg/dL — ABNORMAL HIGH (ref 40–70)
Total  Protein, CSF: 30 mg/dL (ref 15–45)

## 2024-01-14 LAB — VITAMIN B12: Vitamin B-12: 1158 pg/mL — ABNORMAL HIGH (ref 180–914)

## 2024-01-14 LAB — HIV ANTIBODY (ROUTINE TESTING W REFLEX): HIV Screen 4th Generation wRfx: NONREACTIVE

## 2024-01-14 LAB — LACTIC ACID, PLASMA: Lactic Acid, Venous: 0.7 mmol/L (ref 0.5–1.9)

## 2024-01-14 MED ORDER — LEVETIRACETAM (KEPPRA) 500 MG/5 ML ADULT IV PUSH
20.0000 mg/kg | Freq: Once | INTRAVENOUS | Status: AC
  Administered 2024-01-14: 1000 mg via INTRAVENOUS
  Filled 2024-01-14: qty 10

## 2024-01-14 MED ORDER — IOHEXOL 350 MG/ML SOLN
75.0000 mL | Freq: Once | INTRAVENOUS | Status: AC | PRN
  Administered 2024-01-14: 75 mL via INTRAVENOUS

## 2024-01-14 MED ORDER — LACTATED RINGERS IV SOLN: INTRAVENOUS | Status: DC

## 2024-01-14 MED ORDER — SODIUM CHLORIDE 0.9 % IV SOLN
2.0000 g | Freq: Two times a day (BID) | INTRAVENOUS | Status: DC
  Administered 2024-01-14 – 2024-01-15 (×3): 2 g via INTRAVENOUS
  Filled 2024-01-14 (×3): qty 20

## 2024-01-14 MED ORDER — PIPERACILLIN-TAZOBACTAM 3.375 G IVPB
3.3750 g | Freq: Three times a day (TID) | INTRAVENOUS | Status: DC
  Administered 2024-01-14: 3.375 g via INTRAVENOUS
  Filled 2024-01-14 (×2): qty 50

## 2024-01-14 MED ORDER — METRONIDAZOLE 500 MG/100ML IV SOLN: 500.0000 mg | Freq: Once | INTRAVENOUS | Status: DC

## 2024-01-14 MED ORDER — SODIUM CHLORIDE 0.9 % IV SOLN: 2.0000 g | Freq: Three times a day (TID) | INTRAVENOUS | Status: DC

## 2024-01-14 MED ORDER — DEXTROSE 5 % IV SOLN
450.0000 mg | Freq: Three times a day (TID) | INTRAVENOUS | Status: DC
Start: 2024-01-14 — End: 2024-01-15
  Administered 2024-01-14 – 2024-01-15 (×3): 450 mg via INTRAVENOUS
  Filled 2024-01-14 (×6): qty 9

## 2024-01-14 MED ORDER — VANCOMYCIN HCL 750 MG/150ML IV SOLN
750.0000 mg | Freq: Two times a day (BID) | INTRAVENOUS | Status: DC
  Administered 2024-01-14 – 2024-01-15 (×3): 750 mg via INTRAVENOUS
  Filled 2024-01-14 (×3): qty 150

## 2024-01-14 NOTE — TOC Initial Note (Signed)
 Transition of Care [facility]) - Initial/Assessment Note    Patient Details  Name: Kent Peterson MRN: [id number] Date of Birth: May 25, 1990  Transition of [facility]) CM/SW Contact:    Sudie Erminio Deems, RN Phone Number: 01/14/2024, 11:44 AM  Clinical Narrative: Patient presented from PCP office for general weakness and hypotension. Hx chronic encephalopathy and seizures. PTA patient was from home with parental support. Patient's dad was in the room during the visit. ICM spoke with dad and he states patient's home is fully handicap accessible, has a wheelchair and a Merchant [employer] officer for transportation. No home needs identified at this time. ICM will continue to follow for additional disposition needs.               Expected Discharge Plan: Home/Self Care Barriers to Discharge: Continued Medical Work up   Patient Goals and CMS Choice Patient states their goals for this hospitalization and ongoing recovery are:: Plans to return home with parents.          Expected Discharge Plan and Services In-house Referral: NA Discharge Planning Services: CM Consult   Living arrangements for the past 2 months: Single Family Home                   DME Agency: NA       HH Arranged: NA  Prior Living Arrangements/Services Living arrangements for the past 2 months: Single Family Home Lives with:: Parents Patient language and need for interpreter reviewed:: Yes Do you feel safe going back to the place where you live?: Yes      Need for Family Participation in Patient Care: Yes (Comment) Care giver support system in place?: Yes (comment) Current home services: DME (Wheelchair-dad states patient has all DME needed for the home.) Criminal Activity/Legal Involvement Pertinent to Current Situation/Hospitalization: No - Comment as needed  Activities of Daily Living      Permission Sought/Granted Permission sought to share information with : Family Supports, Case Manager                 Emotional Assessment Appearance:: Appears stated age Attitude/Demeanor/Rapport: Unable to Assess Affect (typically observed): Unable to Assess   Alcohol / Substance Use: Not Applicable Psych Involvement: No (comment)  Admission diagnosis:  Generalized weakness [R53.1] Hypothermia, initial encounter [T68.XXXA] Sepsis without acute organ dysfunction, due to unspecified organism [facility]) [A41.9] Patient Active Problem List   Diagnosis Date Noted   Severe sepsis (HCC) 01/14/2024   Generalized weakness 01/13/2024   Chronic health problem 01/13/2024   Hypothermia 02/21/2023   Leukopenia 02/21/2023   Seizure disorder (HCC) 03/12/2017   Chronic static encephalopathy 03/12/2017   PCP:  Tisovec, Richard W, MD Pharmacy:   [facility] [phone] [address], [place] - [address] [name] [address] Phone: [phone] Fax: [phone]     Social Drivers of Health (SDOH) Social History: SDOH Screenings   Food Insecurity: Patient Unable To Answer (02/21/2023)  Housing: High Risk (02/21/2023)  Transportation Needs: Patient Unable To Answer (02/21/2023)  Utilities: Patient Unable To Answer (02/21/2023)  Tobacco Use: Low Risk  (01/13/2024)   SDOH Interventions:     Readmission Risk Interventions     No data to display

## 2024-01-14 NOTE — Progress Notes (Signed)
 Daily Progress Note Intern Pager: [phone]  Patient name: Kent Peterson Medical record number: [id number] Date of birth: 05-17-1990 Age: 33 y.o. Gender: male  Primary Care Provider: Tisovec, Charlie ORN, MD Consultants: Neurology  Code Status: Full   Pt Overview and Major Events to Date:  10/21: Admitted   Medical Decision Making:  Kent Peterson is a 33 y.o. male with a PMH of chronic static encephalopathy who presented due to generalized weakness and then had a witnessed seizure in the ED.  Patient met sepsis criteria on admission, but without a known source.  CTAP pending, considering LP for potential meningitis.  Neurology consulted today. Assessment & Plan Generalized weakness Hypothermia Chronic static encephalopathy Afebrile, hemodynamically stable this morning. S/p Vanc, cefepime , flagyl  in ED. Will avoid cefepime  for concern of lowering seizure threshold.  Procalcitonin less than 0.10.  - Tylenol  650mg  PRN for pain management - Vancomycin  (10/21-), ceftriaxone  (10/22-), acyclovir (10/22-) - LR 50 mL/hr while on acyclovir - monitor fever curve - Bladder scan q8h Labs  - Consider LP if persistently febrile -Neurology consulted, appreciate recommendations Seizure disorder (HCC) No seizures today.  CK within normal limits.  EEG overnight by Dr.Yadav showed generalized cerebral dysfunction (encephalopathy).  And no seizure or epileptiform discharges. - Restart home meds  - Carbamazepine  500mg  BID  - Lamictal  150mg  BID - Ativan  1mg  PRN for seizures lasting >5 minutes. - 1 dose Keppra 1000 mg given by neurology today Chronic health problem GERD - Protonix  40mg  daily Constipation - Miralax  17g daily Behavioral concerns - Risperdal  2mg  BID   FEN/GI: NPO PPx: Lovenox Dispo: Home pending clinical improvement.  Subjective:  Nonverbal at baseline.  Spoke with his parents who are present at bedside.  They state he is much more well-appearing today than he was on  admission.  States he seems more like himself and ate lunch very well today.  States he is not currently holding a cup, but feels that it may be due to the leads and monitors on patient.  Objective: Temp:  [91.6 F (33.1 C)-101.1 F (38.4 C)] 98 F (36.7 C) (10/22 0045) Pulse Rate:  [45-158] 93 (10/22 0415) Resp:  [9-38] 11 (10/22 0415) BP: (92-130)/(44-84) 99/61 (10/22 0415) SpO2:  [83 %-100 %] 96 % (10/22 0415) Weight:  [45.4 kg] 45.4 kg (10/21 1238) Physical Exam: General: NAD Cardiovascular: RRR, no M/R/G Respiratory: CTAB, normal work of breathing on room air Abdomen: Soft, nondistended Extremities: Warm, dry, mild edema on right foot seems to be positional, will reassess in the morning  Laboratory: Most recent CBC Lab Results  Component Value Date   WBC 7.8 01/14/2024   HGB 11.8 (L) 01/14/2024   HCT 34.1 (L) 01/14/2024   MCV 101.5 (H) 01/14/2024   PLT 130 (L) 01/14/2024   Most recent BMP    Latest Ref Rng & Units 01/14/2024    5:06 AM  BMP  Glucose 70 - 99 mg/dL 87   BUN 6 - 20 mg/dL 18   Creatinine 9.38 - 1.24 mg/dL 8.98   Sodium 864 - 854 mmol/L 142   Potassium 3.5 - 5.1 mmol/L 4.0   Chloride 98 - 111 mmol/L 103   CO2 22 - 32 mmol/L 27   Calcium 8.9 - 10.3 mg/dL 9.1    Imaging/Diagnostic Tests: CTAP 1. New nodular airspace disease/consolidation at the posterior left lung base and, to a lesser degree, the right lung base, suggestive of aspiration pneumonitis versus bronchopneumonia. 2. High-density layering material layering dependently within  the bladder, suggestive of debris.  Right knee x-ray No acute osseous abnormality.  Left knee x-ray 1. Soft tissue edema anteriorly. No acute osseous abnormality. 2. Technically limited by positioning.  Kent Raguel MATSU, DO 01/14/2024, 8:34 AM  PGY-1, [facility] Family [facility] FPTS Intern pager: [phone], text pages welcome Secure chat group [facility] Teaching Service

## 2024-01-14 NOTE — Assessment & Plan Note (Addendum)
 Afebrile, hemodynamically stable this morning. S/p Vanc, cefepime , flagyl  in ED. Will avoid cefepime  for concern of lowering seizure threshold.  Procalcitonin less than 0.10.  - Tylenol  650mg  PRN for pain management - Vancomycin  (10/21-), ceftriaxone  (10/22-), acyclovir (10/22-) - LR 50 mL/hr while on acyclovir - monitor fever curve - Bladder scan q8h Labs  - Consider LP if persistently febrile -Neurology consulted, appreciate recommendations

## 2024-01-14 NOTE — Procedures (Signed)
 LUMBAR PUNCTURE (SPINAL TAP) PROCEDURE NOTE  Indication: Evaluate CSF for infection    Proceduralists: Karna Geralds NP    Risks, benefits and alternatives of the procedure were dicussed with the patient including but not limited to post-LP headache, bleeding, infection, weakness/numbness of legs(radiculopathy), death.    Consent obtained from: Father    Procedure Note The patient was prepped and draped, and using sterile technique a 20 gauge quinke spinal needle was inserted in the L4-5 space and was unsuccessful  Patient tolerated the procedure well and blood loss was minimal.

## 2024-01-14 NOTE — Assessment & Plan Note (Addendum)
 No seizures today.  CK within normal limits.  EEG overnight by Dr.Yadav showed generalized cerebral dysfunction (encephalopathy).  And no seizure or epileptiform discharges. - Restart home meds  - Carbamazepine  500mg  BID  - Lamictal  150mg  BID - Ativan  1mg  PRN for seizures lasting >5 minutes. - 1 dose Keppra 1000 mg given by neurology today

## 2024-01-14 NOTE — Assessment & Plan Note (Signed)
 GERD - Protonix  40mg  daily Constipation - Miralax  17g daily Behavioral concerns - Risperdal  2mg  BID

## 2024-01-14 NOTE — Progress Notes (Signed)
 Pt's LP dressing assessed upon return from procedure.  Dressing clean/dry/intact. No drainage noted.   Dressing assessed again when pt.put on clean linen with no drainage noted.

## 2024-01-14 NOTE — Evaluation (Addendum)
 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 male presenting with generalized weakness who initially met SIRS criteria with hypothermia and bradycardia, who is having breakthrough seizures. PMHx significant for chronic state encephalopathy since infancy, seizures, requires assistance with all ADLs at baseline. Per MD note, high suspicion that this is from an infectious cause, though no source has been identified. Suspect that infection has lowered the seizure threshold which is leading to breakthrough seizures. Pt has been adherent to medications.    Assessment / Plan / Recommendation  Clinical Impression  Based on SLP observation and father's report, patient appears to be at his baseline for oropharyngeal swallow. Father fed patient chocolate pudding and apple sause during session. SLP observed oral holding, delayed swallow initiation, and anterior spillage on the left side. During session, the father shared that the patient has been maintaining his weight around 100lbs for the past 20 years. He shared there hasn't been any changes to his medications in the last 15 years. At home, patient takes medicine through apple sauce and pudding. Family cuts up meals at home and reports that patient chews food well at home. Reported difficulty getting patient to drink sometimes. Father did not have any concerns regarding patient's swallow. Mother and father  will be at bedside for all meals. Recommending regular/thin diet. SLP will continue to follow. SLP Visit Diagnosis: Dysphagia, oropharyngeal phase (R13.12)    Aspiration Risk       Diet Recommendation Regular;Thin liquid    Liquid Administration via: Spoon;Straw;Cup Medication Administration: Whole meds with puree Supervision: Full supervision/cueing for compensatory strategies Compensations: Slow rate;Small sips/bites Postural Changes: Seated upright at 90 degrees;Remain upright for at least 30 minutes after po intake    Other  Recommendations Oral Care Recommendations: Oral care BID     Assistance Recommended at Discharge    Functional Status Assessment Patient has had a recent decline in their functional status and demonstrates the ability to make significant improvements in function in a reasonable and predictable amount of time.  Frequency and Duration min 1 x/week  1 week       Prognosis Prognosis for improved oropharyngeal function: Good      Swallow Study   General Date of Onset: 01/14/24 HPI: Patient is a 33 y.o. male presenting with generalized weakness who initially met SIRS criteria with hypothermia and bradycardia, who is having breakthrough seizures. PMHx significant for chronic state encephalopathy since infancy, seizures, requires assistance with all ADLs at baseline. High suspicion that this is from an infectious cause, though no source has been identified. Suspect that infection has lowered the seizure threshold which is leading to breakthrough seizures. Pt has been adherent to medications. Type of Study: Bedside Swallow Evaluation Diet Prior to this Study: NPO Temperature Spikes Noted: No Respiratory Status: Nasal cannula History of Recent Intubation: No Behavior/Cognition: Alert;Cooperative;Pleasant mood Oral Cavity Assessment: Within Functional Limits Oral Care Completed by SLP: No Self-Feeding Abilities: Total assist Patient  Positioning: Upright in bed     Oral/Motor/Sensory Function Overall Oral Motor/Sensory Function: Within functional limits   Ice Chips     Thin Liquid Thin Liquid: Within functional limits Presentation:  (syringe)    Nectar Thick     Honey Thick     Puree Puree: Within functional limits Presentation: Spoon   Solid           Damien Hy  Graduate SLP Clinican

## 2024-01-14 NOTE — Plan of Care (Signed)

## 2024-01-14 NOTE — Progress Notes (Addendum)
 Pharmacy Antibiotic Note  Kent Peterson is a 33 y.o. male admitted on 01/13/2024 with possible sepsis.  Pharmacy has been consulted for Vancomycin  and Zosyn  dosing.  Plan: Vancomycin  750 mg IV q12h Zosyn 3.375 g IV q8h   Height: 5' 7 (170.2 cm) Weight: 45.4 kg (100 lb) IBW/kg (Calculated) : 66.1  Temp (24hrs), Avg:96.9 F (36.1 C), Min:91.6 F (33.1 C), Max:101.1 F (38.4 C)  Recent Labs  Lab 01/13/24 1301 01/13/24 1308 01/13/24 1309 01/13/24 1437 01/13/24 2142  WBC 3.3*  --   --   --   --   CREATININE  --  0.90  --  0.75  --   LATICACIDVEN  --   --  0.3*  --  7.0*    Estimated Creatinine Clearance: 82.8 mL/min (by C-G formula based on SCr of 0.75 mg/dL).    Allergies  Allergen Reactions   Latex Other (See Comments)    Not allergic    Kent Peterson 01/14/2024 1:13 AM

## 2024-01-14 NOTE — Progress Notes (Signed)
 EEG complete - results pending

## 2024-01-14 NOTE — Progress Notes (Signed)
   01/14/24 0045  Assess: MEWS Score  Temp 98 F (36.7 C)  BP 97/66  MAP (mmHg) 77  Pulse Rate 100  ECG Heart Rate 96  Resp 13  Level of Consciousness Responds to Pain  SpO2 99 %  O2 Device Nasal Cannula  O2 Flow Rate (L/min) 5 L/min  Assess: MEWS Score  MEWS Temp 0  MEWS Systolic 1  MEWS Pulse 0  MEWS RR 1  MEWS LOC 2  MEWS Score 4  MEWS Score Color Red  Assess: if the MEWS score is Yellow or Red  Were vital signs accurate and taken at a resting state? Yes  Does the patient meet 2 or more of the SIRS criteria? Yes  Does the patient have a confirmed or suspected source of infection? Yes (MD aware)  MEWS guidelines implemented  Yes, red  Treat  MEWS Interventions Considered administering scheduled or prn medications/treatments as ordered  Take Vital Signs  Increase Vital Sign Frequency  Red: Q1hr x2, continue Q4hrs until patient remains green for 12hrs  Escalate  MEWS: Escalate Red: Discuss with charge nurse and notify provider. Consider notifying RRT. If remains red for 2 hours consider need for higher level of care  Notify: Charge Nurse/RN  Name of Charge Nurse/RN Notified Charge Nurse Aware  Provider Notification  Provider Name/Title Twyla Nearing MD  Method of Notification Call (secure chat)  Notification Reason Other (Comment) (Red MEWS)  Provider response No new orders  Assess: SIRS CRITERIA  SIRS Temperature  0  SIRS Respirations  0  SIRS Pulse 1  SIRS WBC 1  SIRS Score Sum  2

## 2024-01-14 NOTE — Procedures (Signed)
 PROCEDURE SUMMARY:  Successful fluoroscopic guided lumbar puncture at the level of L4-5.  Opening pressure was 19 cm H2O ~15 mL clear colorless fluid collected and sent for labs.   No immediate complications.  Pt tolerated well.   EBL = none  Please see full dictation in imaging section of Epic for procedure details.   Electronically Signed: Tate Zagal M Tyr Franca, PA-C 01/14/2024, 4:44 PM

## 2024-01-14 NOTE — Consult Note (Signed)
 NEUROLOGY CONSULT NOTE   Date of service: January 14, 2024 Patient Name: Kent Peterson MRN:  [id number] DOB:  [birth year] Chief Complaint: encephalopathy, concern for seizure Requesting Provider: Donah Laymon PARAS, MD  History of Present Illness  Kent Peterson is a 33 y.o. male with hx of viral encephalitis at age 20 days complicated by visual impairment, static encephalopathy and epilepsy and stable on carbamazepine  and lamotrigine  at home who presents with a gradual decline over the last 3 weeks and a more significant rapid decline over the last couple days.  Patient at baseline is able to vocalize, can move his arms and looks around.  Dad reports that he has noticed that over the last 3 weeks, patient has been more sleepy.  He typically gets up 6 and 6:30 AM, vocalizes and makes a lot more noise and claps a lot and then it will usually stay up and is able to pick his food with his fingers and feed himself under supervision.  But they have noticed that over the last 3 weeks, he is not as vocal and only stays up for a couple hours at a time.  He has had these sorts of periods of somnolence off-and-on so they did not think too much about it.  However, on Sunday he was way more lethargic and worsened on Monday and and was essentially unarousable on Tuesday.  He was brought into the ED where he was noted to be hypothermic to 91.6.  With warming blankets, he went febrile and tachycardic.  He was able to take his a carbamazepine  and lamotrigine  in the morning.  It seems that he was unable to get his night dose of carbamazepine  and lamotrigine  due to encephalopathy/lethargic.  Patient was treated for sepsis and started on broad-spectrum antibiotics with initially being started on vancomycin  and cefepime  but switched to empiric meningitis coverage with vancomycin , ceftriaxone , acyclovir.  Dad mentions a history of seizures but his last seizure was several months ago.  He has been stable on  carbamazepine  and lamotrigine .  His typical seizure is a full-blown generalized tonic-clonic seizure.  He has never had any other type of seizure.  However, dad does mention that over the last couple months, they have noticed that in the morning, he will have scars on his legs and he is worried that patient might be having seizures in the middle of night that they are not aware of.  Dad feels that the right is about 40% of his baseline at this time.  ROS  Unable to ascertain due to known history of static encephalopathy at baseline.  Past History   Past Medical History:  Diagnosis Date   Chronic 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 He has never used smokeless tobacco. He reports that he does not drink alcohol and does not use drugs.  Allergies  Allergen Reactions   Latex Other (See Comments)    Not allergic    Medications   Current Facility-Administered Medications:    acetaminophen  (TYLENOL ) tablet 650 mg, 650 mg, Oral, Q6H PRN **  OR** acetaminophen  (TYLENOL ) suppository 650 mg, 650 mg, Rectal, Q6H PRN, Zheng, Jacky, MD   acyclovir (ZOVIRAX) 450 mg in dextrose  5 % 100 mL IVPB, 450 mg, Intravenous, Q8H, Ledford, James L, RPH, Held at 01/14/24 1303   carbamazepine  (TEGRETOL  XR) 12 hr tablet 500 mg, 500 mg, Oral, BID, Brown, Carina M, MD, 500 mg at 01/14/24 1108   cefTRIAXone  (ROCEPHIN ) 2 g in sodium chloride  0.9 % 100 mL IVPB, 2 g, Intravenous, BID, Zheng, Jacky, MD, Last Rate: 200 mL/hr at 01/14/24 1051, 2 g at 01/14/24 1051   enoxaparin (LOVENOX) injection 40 mg, 40 mg, Subcutaneous, Q24H, Zheng,  Jacky, MD, 40 mg at 01/13/24 2126   lactated ringers  infusion, , Intravenous, Continuous, Alena Morrison, Reagan, MD, Last Rate: 50 mL/hr at 01/14/24 1255, New Bag at 01/14/24 1255   lamoTRIgine  (LAMICTAL ) tablet 150 mg, 150 mg, Oral, BID, Zheng, Jacky, MD, 150 mg at 01/14/24 1046   LORazepam  (ATIVAN ) injection 1 mg, 1 mg, Intravenous, Q6H PRN, Zheng, Jacky, MD   pantoprazole  (PROTONIX ) EC tablet 40 mg, 40 mg, Oral, Daily, Zheng, Jacky, MD, 40 mg at 01/14/24 1052   polyethylene glycol (MIRALAX  / GLYCOLAX ) packet 17 g, 17 g, Oral, Daily, Elicia Hamlet, MD   risperiDONE  (RISPERDAL ) tablet 2 mg, 2 mg, Oral, BID, Zheng, Jacky, MD, 2 mg at 01/14/24 1109   vancomycin  (VANCOREADY) IVPB 750 mg/150 mL, 750 mg, Intravenous, Q12H, Delores Suzann HERO, MD, Last Rate: 150 mL/hr at 01/14/24 1404, 750 mg at 01/14/24 1404  Vitals   Vitals:   01/14/24 0820 01/14/24 0948 01/14/24 1100 01/14/24 1157  BP: 99/60 101/62  98/64  Pulse: 92  91 83  Resp: 17 17 20 20   Temp:  98.3 F (36.8 C)  98 F (36.7 C)  TempSrc:  Axillary  Axillary  SpO2: 95% 98% 97% 98%  Weight:      Height:        Body mass index is 15.66 kg/m.   Physical Exam   General: Appears chronically ill but seems calm. HENT: Normal oropharynx and mucosa. Normal external appearance of ears and nose.  Neck: Supple, no pain or tenderness  CV: No JVD. No peripheral edema.  Pulmonary: Symmetric Chest rise. Normal respiratory effort.  Abdomen: Soft to touch, non-tender.  Ext: No cyanosis, edema, or deformity  Skin: No rash. Normal palpation of skin.   Musculoskeletal: Normal digits and nails by inspection. No clubbing.   Neurologic Examination  Mental status/Cognition: Eyes initially closed, does open eyes to touch and voice and keeps eyes open.  Nonverbal.  No vocalization noted. speech/language: Mute, no speech, no verbalization.   Cranial nerves:   CN II Pupils equal and reactive to light.  Unable to assess for visual field deficit.  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   CN III,IV,VI Mild disconjugate gaze   CN V Reactive to light touch in his face bilaterally.   CN VII No obvious facial droop.   CN VIII Does open eyes to speech.   CN IX & X Unable to assess but seems to be protecting his airway.   CN XI Head is midline.   CN XII Does not protrude tongue on command.   Sensory/motor:  Muscle bulk: Poor, tone increased in all extremities. Limited movement to light stimulation in bilateral uppers. No movement to light stimulation in bilateral lowers. To Babinski, there is a slight withdrawal in the big toe bilaterally.  Coordination/Complex Motor:  Unable to assess. Labs/Imaging/Neurodiagnostic studies   CBC:  Recent Labs  Lab 01/13/24 1301 01/13/24 1308 01/14/24 0506  WBC 3.3*  --  7.8  NEUTROABS 2.3  --   --   HGB 12.5* 12.6* 11.8*  HCT 37.0* 37.0* 34.1*  MCV 103.1*  --  101.5*  PLT 128*  --  130*   Basic Metabolic Panel:  Lab Results  Component Value Date   NA 142 01/14/2024   K 4.0 01/14/2024   CO2 27 01/14/2024   GLUCOSE 87 01/14/2024   BUN 18 01/14/2024   CREATININE 1.01 01/14/2024   CALCIUM 9.1 01/14/2024   GFRNONAA >60 01/14/2024   GFRAA 135 06/14/2019   Lipid Panel: No results found for: LDLCALC HgbA1c: No results found for: HGBA1C Urine Drug Screen: No results found for: LABOPIA, COCAINSCRNUR, LABBENZ, AMPHETMU, THCU, LABBARB  Alcohol Level No results found for: [facility] INR  Lab Results  Component Value Date   INR 0.9 02/20/2023   APTT  Lab Results  Component Value Date   APTT 34 02/20/2023   AED levels:  Lab Results  Component Value Date   LAMOTRIGINE  8.7 06/30/2023    CT Head without contrast(Personally reviewed): 1. No acute intracranial abnormality. 2. Unchanged extensive encephalomalacia in both cerebral hemispheres.  MRI Brain(Personally reviewed): Pending.  Neurodiagnostics rEEG:  This study is suggestive of generalized cerebral dysfunction  (encephalopathy). No seizures or epileptiform discharges were seen throughout the recording.   ASSESSMENT   Kent Peterson is a 33 y.o. male with hx of viral encephalitis at age 31 days complicated by visual impairment, static encephalopathy and epilepsy and stable on carbamazepine  and lamotrigine  at home who presents with a gradual decline over the last 3 weeks and a more significant rapid decline over the last couple days.  Presented hypothermic, bradycardic and then went hypothermic along with significant tachycardia and elevated lactate.  Presentation concerning for sepsis and so far, no clear source has been established with UA not consistent with a UTI, CXR not concerning for pneumonia.  Respiratory panel negative for RSV, flu, COVID.  I do think we need to entertain the diagnosis of meningitis given sepsis of unclear origin. Father also worried about subclinical seizures or seizures in his sleep as he has mentioned that patient has been less focal over the last 3 weeks and they noted that sometimes in the morning, his leg would appear somewhat scratched.  RECOMMENDATIONS  - I do think it is prudent to rule out meningitis.  LP was attempted at bedside and failed.  Plan is to do LP under fluoroscopy and in the meantime I recommend continuing empiric meningitis coverage.  Get CSF cell count and differential x 2, CSF protein and glucose, meningitis/encephalitis panel, CSF Gram stain with culture. - Will also put him up on continuous EEG to evaluate for any subclinical seizures.  Will use MRI compatible leads. - Will attempt to get an MRI but my concern is that patient may not stay still for the MRI. ______________________________________________________________________    Signed, Christinea Brizuela, MD [facility]

## 2024-01-14 NOTE — Procedures (Signed)
 Patient Name: Kent Peterson  MRN: [id number]  Epilepsy Attending: Arlin MALVA Krebs  Referring Physician/Provider: Elicia Hamlet, MD  Date: 01/14/2024 Duration: 22.27 mins  Patient history: 33 y.o. male with a PMH of chronic static encephalopathy who presented due to generalized weakness and then had a witnessed seizure in the ED.   Level of alertness: Awake  AEDs during EEG study: LEV, LTG  Technical aspects: This EEG study was done with scalp electrodes positioned according to the 10-20 International system of electrode placement. Electrical activity was reviewed with band pass filter of 1-70Hz , sensitivity of 7 uV/mm, display speed of 21mm/sec with a 60Hz  notched filter applied as appropriate. EEG data were recorded continuously and digitally stored.  Video monitoring was available and reviewed as appropriate.  Description: EEG showed continuous generalized 3 to 6 Hz theta-delta slowing admixed with 13-15hz  beta activity. Hyperventilation and photic stimulation were not performed.     ABNORMALITY - Continuous slow, generalized  IMPRESSION: This study is suggestive of generalized cerebral dysfunction (encephalopathy). No seizures or epileptiform discharges were seen throughout the recording.  Bentli Llorente O Trinidad Petron

## 2024-01-14 NOTE — Progress Notes (Signed)
 Pharmacy Antibiotic Note  Kent Peterson is a 33 y.o. male admitted on 01/13/2024 with rule out meningitis.  Pharmacy has been consulted for Acyclovir dosing. WBC WNL. Renal function ok.   Plan: Acyclovir 10 mg/kg IV q8h Already on vancomycin  and ceftriaxone  Already has fluids running F/U meningitis work-up  Height: 5' 7 (170.2 cm) Weight: 45.4 kg (100 lb) IBW/kg (Calculated) : 66.1  Temp (24hrs), Avg:96.9 F (36.1 C), Min:91.6 F (33.1 C), Max:101.1 F (38.4 C)  Recent Labs  Lab 01/13/24 1301 01/13/24 1308 01/13/24 1309 01/13/24 1437 01/13/24 2142 01/14/24 0113 01/14/24 0506  WBC 3.3*  --   --   --   --   --  7.8  CREATININE  --  0.90  --  0.75  --   --  1.01  LATICACIDVEN  --   --  0.3*  --  7.0* 0.7  --     Estimated Creatinine Clearance: 65.6 mL/min (by C-G formula based on SCr of 1.01 mg/dL).    Allergies  Allergen Reactions   Latex Other (See Comments)    Not allergic    Lynwood Mckusick, PharmD, BCPS Clinical Pharmacist Phone: [phone]

## 2024-01-14 NOTE — Progress Notes (Signed)
 Arrived to room pt having LP will try back for LTM EEG after procedure is complete.

## 2024-01-14 NOTE — Progress Notes (Signed)
 LTM EEG hooked up and running - no initial skin breakdown - push button tested - [facility] monitoring. MRI safe leads used. Headcap placed due to pt moving head a lot against pillow.

## 2024-01-14 NOTE — Plan of Care (Signed)
  Problem: Clinical Measurements: Goal: Diagnostic test results will improve Outcome: Progressing Goal: Respiratory complications will improve Outcome: Progressing   Problem: Activity: Goal: Risk for activity intolerance will decrease Outcome: Progressing   

## 2024-01-15 ENCOUNTER — Inpatient Hospital Stay (HOSPITAL_COMMUNITY)

## 2024-01-15 DIAGNOSIS — R531 Weakness: Secondary | ICD-10-CM | POA: Diagnosis not present

## 2024-01-15 DIAGNOSIS — R569 Unspecified convulsions: Secondary | ICD-10-CM | POA: Diagnosis not present

## 2024-01-15 DIAGNOSIS — G40909 Epilepsy, unspecified, not intractable, without status epilepticus: Secondary | ICD-10-CM | POA: Diagnosis not present

## 2024-01-15 DIAGNOSIS — A419 Sepsis, unspecified organism: Secondary | ICD-10-CM | POA: Diagnosis not present

## 2024-01-15 LAB — VDRL, CSF: VDRL Quant, CSF: NONREACTIVE

## 2024-01-15 LAB — BASIC METABOLIC PANEL WITH GFR
Anion gap: 10 (ref 5–15)
BUN: 11 mg/dL (ref 6–20)
CO2: 29 mmol/L (ref 22–32)
Calcium: 9 mg/dL (ref 8.9–10.3)
Chloride: 105 mmol/L (ref 98–111)
Creatinine, Ser: 0.84 mg/dL (ref 0.61–1.24)
GFR, Estimated: >60 mL/min (ref >60)
Glucose, Bld: 91 mg/dL (ref 70–99)
Potassium: 3.5 mmol/L (ref 3.5–5.1)
Sodium: 144 mmol/L (ref 135–145)

## 2024-01-15 LAB — CBC
HCT: 30.6 % — ABNORMAL LOW (ref 39.0–52.0)
Hemoglobin: 10.4 g/dL — ABNORMAL LOW (ref 13.0–17.0)
MCH: 35.4 pg — ABNORMAL HIGH (ref 26.0–34.0)
MCHC: 34 g/dL (ref 30.0–36.0)
MCV: 104.1 fL — ABNORMAL HIGH (ref 80.0–100.0)
Platelets: 113 K/uL — ABNORMAL LOW (ref 150–400)
RBC: 2.94 MIL/uL — ABNORMAL LOW (ref 4.22–5.81)
RDW: 13.6 % (ref 11.5–15.5)
WBC: 6 K/uL (ref 4.0–10.5)
nRBC: 0 % (ref 0.0–0.2)

## 2024-01-15 MED ORDER — ENSURE PLUS HIGH PROTEIN PO LIQD
237.0000 mL | Freq: Every day | ORAL | Status: DC
  Administered 2024-01-16 – 2024-01-17 (×2): 237 mL via ORAL

## 2024-01-15 MED ORDER — SODIUM CHLORIDE 0.9 % IV SOLN
500.0000 mg | INTRAVENOUS | Status: DC
  Administered 2024-01-15 – 2024-01-17 (×3): 500 mg via INTRAVENOUS
  Filled 2024-01-15 (×3): qty 5

## 2024-01-15 MED ORDER — SODIUM CHLORIDE 0.9 % IV SOLN
2.0000 g | INTRAVENOUS | Status: DC
  Administered 2024-01-16 – 2024-01-17 (×2): 2 g via INTRAVENOUS
  Filled 2024-01-15 (×2): qty 20

## 2024-01-15 MED ORDER — GADOBUTROL 1 MMOL/ML IV SOLN
5.0000 mL | Freq: Once | INTRAVENOUS | Status: AC | PRN
  Administered 2024-01-15: 5 mL via INTRAVENOUS

## 2024-01-15 MED ORDER — THIAMINE HCL 100 MG/ML IJ SOLN
100.0000 mg | Freq: Every day | INTRAMUSCULAR | Status: DC
Start: 2024-01-15 — End: 2024-01-17
  Filled 2024-01-15 (×2): qty 2

## 2024-01-15 MED ORDER — THIAMINE MONONITRATE 100 MG PO TABS
100.0000 mg | ORAL_TABLET | Freq: Every day | ORAL | Status: DC
  Administered 2024-01-15 – 2024-01-17 (×3): 100 mg via ORAL
  Filled 2024-01-15 (×4): qty 1

## 2024-01-15 NOTE — Progress Notes (Signed)
 vLTM discontinued  No skin breakdown noted at all skin sites  [facility] notified

## 2024-01-15 NOTE — Assessment & Plan Note (Addendum)
 Afebrile, hemodynamically stable this morning. - Tylenol  650mg  PRN for pain management - Ceftriaxone  (10/22-) - DC vancomycin , low suspicion for MRSA  - DC LR today with acyclovir  - monitor fever curve - Bladder scan q8h -Neurology consulted, appreciate recommendations  - Continuous EEG  - DC acyclovir   - Attempt MRI

## 2024-01-15 NOTE — Progress Notes (Signed)
 SLP Cancellation Note  Patient Details Name: ALANDO COLLERAN MRN: [id number] DOB: Jul 01, 1990   Cancelled treatment:       Reason Eval/Treat Not Completed: Other (comment) SLP spoke briefly with patient's mother in room. She denies any change in patient's PO intake or swallow abilities. SLP briefly discussed potential for objective swallow assessment if there was concern for aspiration. Mother does not feel that objective testing is necessary at this time. SLP to s/o and recommend to reorder SLP if any concerns regarding swallow function.    Norleen IVAR Blase, MA, CCC-SLP Speech Therapy  Blase Norleen Tarrell 01/15/2024, 9:23 AM

## 2024-01-15 NOTE — Progress Notes (Signed)
 LTM maint complete - no skin breakdown under: fp1,fp2,p4

## 2024-01-15 NOTE — Procedures (Addendum)
 Patient Name: Kent Peterson  MRN: [id number]  Epilepsy Attending: Arlin MALVA Krebs  Referring Physician/Provider: Khaliqdina, Salman, MD  Duration: 01/14/2024 1532 to 01/15/2024 1329   Patient history: 33 y.o. male with a PMH of chronic static encephalopathy who presented due to generalized weakness and then had a witnessed seizure in the ED.    Level of alertness: Awake/ lethargic   AEDs during EEG study: LEV, LTG   Technical aspects: This EEG study was done with scalp electrodes positioned according to the 10-20 International system of electrode placement. Electrical activity was reviewed with band pass filter of 1-70Hz , sensitivity of 7 uV/mm, display speed of 65mm/sec with a 60Hz  notched filter applied as appropriate. EEG data were recorded continuously and digitally stored.  Video monitoring was available and reviewed as appropriate.   Description: EEG showed continuous generalized 3 to 6 Hz theta-delta slowing admixed with 13-15hz  beta activity. Hyperventilation and photic stimulation were not performed.     EEG was disconnected between 01/14/2024 1604 to 1716 for LP   ABNORMALITY - Continuous slow, generalized   IMPRESSION: This study is suggestive of generalized cerebral dysfunction (encephalopathy). No seizures or epileptiform discharges were seen throughout the recording.   Kent Peterson

## 2024-01-15 NOTE — Plan of Care (Signed)
 Patient is cognitive impaired

## 2024-01-15 NOTE — Plan of Care (Signed)
  Problem: Clinical Measurements: Goal: Respiratory complications will improve Outcome: Progressing Goal: Cardiovascular complication will be avoided Outcome: Progressing   Problem: Coping: Goal: Level of anxiety will decrease Outcome: Progressing   

## 2024-01-15 NOTE — Assessment & Plan Note (Addendum)
 No seizures today.  Continuous EEG per neurology.  - Continue home meds  - Carbamazepine  500mg  BID  - Lamictal  150mg  BID - EEG showed generalized cerebral dysfunction (encephalopathy), no seizures or epileptiform discharges.  - Ativan  1mg  PRN for seizures lasting >5 minutes.

## 2024-01-15 NOTE — Assessment & Plan Note (Signed)
 GERD - Protonix  40mg  daily Constipation - Miralax  17g daily Behavioral concerns - Risperdal  2mg  BID

## 2024-01-15 NOTE — Progress Notes (Addendum)
 NEUROLOGY CONSULT FOLLOW UP NOTE   Date of service: January 15, 2024 Patient Name: Kent Peterson MRN:  [id number] DOB:  1990-04-24  Interval Hx/subjective   No seizures on LTM. Discussed with mom at the bedside. He is improved. More vocal today with a social smile. Moving his hands more. Mother fed him food and he ate it fine.  Vitals   Vitals:   01/14/24 2015 01/14/24 2316 01/15/24 0415 01/15/24 0820  BP: 96/66 102/70 101/69 108/74  Pulse: 69 75 79   Resp: 15 14 15    Temp:  98.3 F (36.8 C) 98.5 F (36.9 C) 98.3 F (36.8 C)  TempSrc:  Axillary Axillary Axillary  SpO2: 97% 98% 97%   Weight:      Height:         Body mass index is 15.66 kg/m.  Physical Exam   General: Appears chronically ill but seems calm. HENT: Normal oropharynx and mucosa. Normal external appearance of ears and nose.  Neck: Supple, no pain or tenderness  CV: No JVD. No peripheral edema.  Pulmonary: Symmetric Chest rise. Normal respiratory effort.  Abdomen: Soft to touch, non-tender.  Ext: No cyanosis, edema, or deformity  Skin: No rash. Normal palpation of skin.   Musculoskeletal: Normal digits and nails by inspection. No clubbing.    Neurologic Examination  Mental status/Cognition: eyes open, does not make eye contact but looks in the direction of the examiner. speech/language: vocalizes spontaneously. No comprehensible speech. Cranial nerves:   CN II Pupils equal and reactive to light.  Unable to assess for visual field deficit.  Has cortical blindness at baseline.   CN III,IV,VI Mild disconjugate gaze   CN V Reactive to light touch in his face bilaterally.   CN VII No obvious facial droop.   CN VIII Does open eyes to speech.   CN IX & X Unable to assess but seems to be protecting his airway.   CN XI Head is midline.   CN XII Does not protrude tongue on command.    Sensory/motor:  Muscle bulk: Poor, tone increased in all extremities. Moving BL uppers today antigravity and  spontaneously No movement to light stimulation in bilateral lowers. This is baseline for him, he has hip deformity per mother. To Babinski, there is a slight withdrawal in the big toe bilaterally.  Medications  Current Facility-Administered Medications:    acetaminophen  (TYLENOL ) tablet 650 mg, 650 mg, Oral, Q6H PRN **OR** acetaminophen  (TYLENOL ) suppository 650 mg, 650 mg, Rectal, Q6H PRN, Zheng, Jacky, MD   azithromycin (ZITHROMAX) 500 mg in sodium chloride  0.9 % 250 mL IVPB, 500 mg, Intravenous, Q24H, Bhagat, Virali, DO   carbamazepine  (TEGRETOL  XR) 12 hr tablet 500 mg, 500 mg, Oral, BID, Brown, Carina M, MD, 500 mg at 01/15/24 1050   [START ON 01/16/2024] cefTRIAXone  (ROCEPHIN ) 2 g in sodium chloride  0.9 % 100 mL IVPB, 2 g, Intravenous, Q24H, Bhagat, Virali, DO   enoxaparin (LOVENOX) injection 40 mg, 40 mg, Subcutaneous, Q24H, Zheng, Jacky, MD, 40 mg at 01/14/24 2229   lamoTRIgine  (LAMICTAL ) tablet 150 mg, 150 mg, Oral, BID, Zheng, Jacky, MD, 150 mg at 01/15/24 1000   LORazepam  (ATIVAN ) injection 1 mg, 1 mg, Intravenous, Q6H PRN, Zheng, Jacky, MD   pantoprazole  (PROTONIX ) EC tablet 40 mg, 40 mg, Oral, Daily, Zheng, Jacky, MD, 40 mg at 01/15/24 1000   polyethylene glycol (MIRALAX  / GLYCOLAX ) packet 17 g, 17 g, Oral, Daily, Zheng, Jacky, MD, 17 g at 01/15/24 1000   risperiDONE  (RISPERDAL ) tablet 2  mg, 2 mg, Oral, BID, Zheng, Jacky, MD, 2 mg at 01/15/24 1000   thiamine (VITAMIN B1) injection 100 mg, 100 mg, Intravenous, Daily **OR** thiamine (VITAMIN B1) tablet 100 mg, 100 mg, Oral, Daily, Odelle Kosier, MD, 100 mg at 01/15/24 1000  Labs and Diagnostic Imaging   CBC:  Recent Labs  Lab 01/13/24 1301 01/13/24 1308 01/14/24 0506 01/15/24 0413  WBC 3.3*  --  7.8 6.0  NEUTROABS 2.3  --   --   --   HGB 12.5*   < > 11.8* 10.4*  HCT 37.0*   < > 34.1* 30.6*  MCV 103.1*  --  101.5* 104.1*  PLT 128*  --  130* 113*   < > = values in this interval not displayed.    Basic Metabolic  Panel:  Lab Results  Component Value Date   NA 144 01/15/2024   K 3.5 01/15/2024   CO2 29 01/15/2024   GLUCOSE 91 01/15/2024   BUN 11 01/15/2024   CREATININE 0.84 01/15/2024   CALCIUM 9.0 01/15/2024   GFRNONAA >60 01/15/2024   GFRAA 135 06/14/2019   Lipid Panel: No results found for: LDLCALC HgbA1c: No results found for: HGBA1C Urine Drug Screen: No results found for: LABOPIA, COCAINSCRNUR, LABBENZ, AMPHETMU, THCU, LABBARB  Alcohol Level No results found for: [facility] INR  Lab Results  Component Value Date   INR 0.9 02/20/2023   APTT  Lab Results  Component Value Date   APTT 34 02/20/2023   AED levels:  Lab Results  Component Value Date   LAMOTRIGINE  8.7 06/30/2023    CT Head without contrast(Personally reviewed): CTH was negative for a large hypodensity concerning for a large territory infarct or hyperdensity concerning for an ICH  cEEG:  No seizures.  CSF studies reviewed and not concerning for meningitis/encephalitis.  Assessment   Kent Peterson is a 33 y.o. male with hx of viral encephalitis at age 32 days complicated by visual impairment, static encephalopathy and epilepsy and stable on carbamazepine  and lamotrigine  at home who presents with a gradual decline over the last 3 weeks and a more significant rapid decline over the last couple days.  Presented hypothermic, bradycardic and then went hypothermic along with significant tachycardia and elevated lactate.  Presentation concerning for sepsis with no clear source. UA not consistent with a UTI, CXR not concerning for pneumonia, LP with CSF not concerning for meningitis/encephalitis.  Respiratory panel negative for RSV, flu, COVID. cEEG so far with no seizures.  Encephalopathy labs with normal B12, folate, TSH. Thiamine levels are pending and will continue thiamine replacement until levels are back.  Recommendations  - continue thiamine 100mg  daily IV or PO - continue home Carbamezapine and  Lamotrigine  - Stop empiric meningitis coverage. - discontinue LTM EEG. - We can attempt MRI but if he does not tolerate it, I would not push for it. I don't want him to get sedating medications, specially since he is improving. ______________________________________________________________________  Plan discussed with mother at the bedside and with primary team in AM over secure chat.  Signed, Dossie Ocanas, MD [facility]

## 2024-01-15 NOTE — Progress Notes (Signed)
     Daily Progress Note Intern Pager: [phone]  Patient name: Kent Peterson Medical record number: [id number] Date of birth: May 19, 1990 Age: 33 y.o. Gender: male  Primary Care Provider: Tisovec, Charlie ORN, MD Consultants: Neurology  Code Status: Full   Pt Overview and Major Events to Date:  10/21: Admitted  Medical Decision Making:  Kent Peterson is a 33 y.o. male with  PMH of chronic static encephalopathy who presented due to generalized weakness and then had a witnessed seizure in the ED. Patient met sepsis criteria on admission, CTAP with possible aspiration pneumonia. Initial concern for meningitis, but seems unlikely from current LP results, discontinued acyclovir with neurology's recommendation this morning. Continuous EEG per neurology.  Assessment & Plan Generalized weakness Hypothermia Chronic static encephalopathy Afebrile, hemodynamically stable this morning. - Tylenol  650mg  PRN for pain management - Ceftriaxone  (10/22-) - DC vancomycin , low suspicion for MRSA  - DC LR today with acyclovir  - monitor fever curve - Bladder scan q8h -Neurology consulted, appreciate recommendations  - Continuous EEG  - DC acyclovir   - Attempt MRI Seizure disorder (HCC) No seizures today.  Continuous EEG per neurology.  - Continue home meds  - Carbamazepine  500mg  BID  - Lamictal  150mg  BID - EEG showed generalized cerebral dysfunction (encephalopathy), no seizures or epileptiform discharges.  - Ativan  1mg  PRN for seizures lasting >5 minutes. Chronic health problem GERD - Protonix  40mg  daily Constipation - Miralax  17g daily Behavioral concerns - Risperdal  2mg  BID  FEN/GI: Regular PPx: Lovenox  Dispo: Home pending clinical improvement.   Subjective:  Patient lying in bed this morning, awake and vocalizing.  His mother is present at bedside and states that he ate breakfast well this morning.  He is still not holding a cup which is part of his baseline.  So, his mother  is using a syringe to give him fluids.  Patient has a nosebleed from his right nostril this morning.  Objective: Temp:  [98 F (36.7 C)-98.5 F (36.9 C)] 98.5 F (36.9 C) (10/23 0415) Pulse Rate:  [66-92] 79 (10/23 0415) Resp:  [10-20] 15 (10/23 0415) BP: (95-102)/(60-70) 101/69 (10/23 0415) SpO2:  [93 %-98 %] 97 % (10/23 0415) Physical Exam: General: NAD, awake and vocalizing this morning  Cardiovascular: RRR, no M/R/G Respiratory: CTAB, normal work of breathing on room air Abdomen: soft, non-distended, non-tender to palpation  Extremities: +1 pitting edema on R foot   Laboratory: Most recent CBC Lab Results  Component Value Date   WBC 6.0 01/15/2024   HGB 10.4 (L) 01/15/2024   HCT 30.6 (L) 01/15/2024   MCV 104.1 (H) 01/15/2024   PLT 113 (L) 01/15/2024   Most recent BMP    Latest Ref Rng & Units 01/15/2024    4:13 AM  BMP  Glucose 70 - 99 mg/dL 91   BUN 6 - 20 mg/dL 11   Creatinine 9.38 - 1.24 mg/dL 9.15   Sodium 864 - 854 mmol/L 144   Potassium 3.5 - 5.1 mmol/L 3.5   Chloride 98 - 111 mmol/L 105   CO2 22 - 32 mmol/L 29   Calcium 8.9 - 10.3 mg/dL 9.0    Imaging/Diagnostic Tests: No new imaging.   Kent Raguel MATSU, DO 01/15/2024, 7:45 AM  PGY-1, [facility] Family Medicine FPTS Intern pager: [phone], text pages welcome Secure chat group [facility] Teaching Service

## 2024-01-15 NOTE — Progress Notes (Signed)
 Initial Nutrition Assessment  DOCUMENTATION CODES:   Underweight  INTERVENTION:  Regular diet as tolerated Encourage PO intake  Ensure Plus High Protein po daily, each supplement provides 350 kcal and 20 grams of protein.   NUTRITION DIAGNOSIS:   Underweight related to chronic illness as evidenced by  (BMI < 18.5).   GOAL:   Patient will meet greater than or equal to 90% of their needs   MONITOR:   PO intake, Supplement acceptance  REASON FOR ASSESSMENT:   Consult Assessment of nutrition requirement/status  ASSESSMENT:   PMH of chronic static encephalopathy who presented due to generalized weakness and then had a witnessed seizure in the ED. Patient met sepsis criteria on admission, CTAP with possible aspiration pneumonia.  Patient seen in room sleeping, pt's dad at beside provided hx. Pt weight has been stable around 100 lbs for the past 15 years, dad reports patient has always been lean/smaller frame as and adult. Pt eats meals similar to his parents, eats 3 meals per day of whatever his parents eat for meals and will have 2-3 snacks during the day as well as an ensure drink in the morning. Dad reports patient has not had a big appetite for a few days PTA but now seems to be improving, pt ate all of his breakfast this morning and ate 80% of his lunch. No reports of nausea or abd pain with PO intake.   Admit weight: 45.4 kg  Current weight: 45.4 kg  Nutritionally Relevant Medications: Protonix , miralax , 100 mg thiamine   Labs Reviewed:    NUTRITION - FOCUSED PHYSICAL EXAM:  Flowsheet Row Most Recent Value  Orbital Region No depletion  Upper Arm Region No depletion  Thoracic and Lumbar Region No depletion  Buccal Region No depletion  Temple Region No depletion  Clavicle Bone Region Mild depletion  Clavicle and Acromion Bone Region No depletion  Scapular Bone Region No depletion  Dorsal Hand No depletion  Patellar Region No depletion  Anterior Thigh Region No  depletion  Posterior Calf Region Mild depletion  Hair Reviewed  Eyes Unable to assess  [pt sleeping]  Mouth Unable to assess  [pt sleeping]  Skin Reviewed  Nails Reviewed    Diet Order:   Diet Order             Diet regular Room service appropriate? Yes; Fluid consistency: Thin  Diet effective now                   EDUCATION NEEDS:   No education needs have been identified at this time  Skin:  Skin Assessment: Reviewed RN Assessment  Last BM:  10/19  Height:   Ht Readings from Last 1 Encounters:  01/13/24 5' 7 (1.702 m)    Weight:   Wt Readings from Last 1 Encounters:  01/13/24 45.4 kg     BMI:  Body mass index is 15.66 kg/m.  Estimated Nutritional Needs:   Kcal:  1360-1600 kcals  Protein:  55-68 grams  Fluid:  1.3-1.6 L/d  Madalyn Potters, MS, RD, LDN Clinical Dietitian  Contact via secure chat. If unavailable, use group chat RD Inpatient.

## 2024-01-16 DIAGNOSIS — R531 Weakness: Secondary | ICD-10-CM | POA: Diagnosis not present

## 2024-01-16 DIAGNOSIS — J690 Pneumonitis due to inhalation of food and vomit: Secondary | ICD-10-CM | POA: Insufficient documentation

## 2024-01-16 LAB — CBC
HCT: 29.2 % — ABNORMAL LOW (ref 39.0–52.0)
Hemoglobin: 9.9 g/dL — ABNORMAL LOW (ref 13.0–17.0)
MCH: 35.5 pg — ABNORMAL HIGH (ref 26.0–34.0)
MCHC: 33.9 g/dL (ref 30.0–36.0)
MCV: 104.7 fL — ABNORMAL HIGH (ref 80.0–100.0)
Platelets: 131 K/uL — ABNORMAL LOW (ref 150–400)
RBC: 2.79 MIL/uL — ABNORMAL LOW (ref 4.22–5.81)
RDW: 13.6 % (ref 11.5–15.5)
WBC: 4.5 K/uL (ref 4.0–10.5)
nRBC: 0 % (ref 0.0–0.2)

## 2024-01-16 NOTE — Assessment & Plan Note (Addendum)
 Empiric meningitic antibiotic coverage discontinued yesterday, which patient tolerated well without any fevers or vital sign instability.  Brain MRI without acute findings or change in chronic findings.  Blood cultures continue to have no growth, now 3 days.  CSF cultures also with no growth thus far.  He has been afebrile for the last 48 hours. -Neurology has signed off - Would be stable for transition to oral antibiotics today with overnight observation and hopeful discharge tomorrow if clinically stable; however, as we would complete a 5-day course of cephalosporin and 3-day course of macrolide which would end tomorrow, we will continue his current antibiotic regimen with ceftriaxone  and azithromycin -Continue to follow every 8 hours bladder scans -Continue Tylenol  -Lab holiday

## 2024-01-16 NOTE — Progress Notes (Signed)
 Brief Neuro Note:  Spoke with mom at the bedside. MRI brain with no acute changes. Stable noted prior abnormalities. Patient is asleep. Doing really good today and close to baseline.  Neurology will signoff. Follow up with Dr. Gregg outpatient.  Discussed with Family medicine team over secure chat.  Kent Peterson [facility]

## 2024-01-16 NOTE — Assessment & Plan Note (Signed)
 GERD - Protonix  40mg  daily Constipation - Miralax  17g daily Behavioral concerns - Risperdal  2mg  BID

## 2024-01-16 NOTE — Progress Notes (Signed)
     Daily Progress Note Intern Pager: [phone]  Patient name: Kent Peterson Medical record number: [id number] Date of birth: 10/28/1990 Age: 33 y.o. Gender: male  Primary Care Provider: Tisovec, Richard W, MD Consultants: Neurology Code Status: Full  Pt Overview and Major Events to Date:  10/21: Admitted  Assessment and Plan:  WELLS MABE is a 33 y.o. male with PMH of chronic static encephalopathy, epilepsy who was admitted for generalized weakness, with a witnessed seizure in the ED suspected secondary to decreased seizure threshold given suspected aspiration pneumonia whose clinical status is stable and improving at this time. Assessment & Plan Generalized weakness Hypothermia Chronic static encephalopathy Aspiration pneumonia (HCC) Empiric meningitic antibiotic coverage discontinued yesterday, which patient tolerated well without any fevers or vital sign instability.  Brain MRI without acute findings or change in chronic findings.  Blood cultures continue to have no growth, now 3 days.  CSF cultures also with no growth thus far.  He has been afebrile for the last 48 hours. -Neurology has signed off - Would be stable for transition to oral antibiotics today with overnight observation and hopeful discharge tomorrow if clinically stable; however, as we would complete a 5-day course of cephalosporin and 3-day course of macrolide which would end tomorrow, we will continue his current antibiotic regimen with ceftriaxone  and azithromycin -Continue to follow every 8 hours bladder scans -Continue Tylenol  -Lab holiday Seizure disorder (HCC) Continuous EEG yesterday did not reveal any seizures - Neurology has signed off and recommended further outpatient follow-up - Continue home meds  - Carbamazepine  500mg  BID  - Lamictal  150mg  BID - Ativan  1mg  PRN for seizures lasting >5 minutes. Chronic health problem GERD - Protonix  40mg  daily Constipation - Miralax  17g daily Behavioral  concerns - Risperdal  2mg  BID  FEN/GI: Regular PPx: Lovenox Dispo:Home tomorrow.   Subjective:  Patient is resting comfortably in bed and does awake during exam without appearing uncomfortable.  Mother at bedside reporting he is doing well.  Continues to not hold his Cup, but is eating well.  Objective: Temp:  [97.7 F (36.5 C)-98.9 F (37.2 C)] 97.7 F (36.5 C) (10/24 0700) Pulse Rate:  [57-76] 57 (10/24 0700) Resp:  [10-16] 10 (10/24 0700) BP: (100-119)/(66-83) 100/66 (10/24 0700) SpO2:  [100 %] 100 % (10/24 0700) Physical Exam: General: Appearing male sleeping comfortably in bed CV: Regular rhythm, bradycardic, no murmurs Pulm: Shallow respirations as patient is sleeping, do not appreciate any crackles  Laboratory: Most recent CBC Lab Results  Component Value Date   WBC 4.5 01/16/2024   HGB 9.9 (L) 01/16/2024   HCT 29.2 (L) 01/16/2024   MCV 104.7 (H) 01/16/2024   PLT 131 (L) 01/16/2024   Most recent BMP    Latest Ref Rng & Units 01/15/2024    4:13 AM  BMP  Glucose 70 - 99 mg/dL 91   BUN 6 - 20 mg/dL 11   Creatinine 9.38 - 1.24 mg/dL 9.15   Sodium 864 - 854 mmol/L 144   Potassium 3.5 - 5.1 mmol/L 3.5   Chloride 98 - 111 mmol/L 105   CO2 22 - 32 mmol/L 29   Calcium 8.9 - 10.3 mg/dL 9.0     Imaging/Diagnostic Tests: None  Alena Morrison, Elio, MD 01/16/2024, 7:48 AM  PGY-1, [place] [facility] FPTS Intern pager: [phone], text pages welcome Secure chat group [facility] Teaching Service

## 2024-01-16 NOTE — Assessment & Plan Note (Addendum)
 Continuous EEG yesterday did not reveal any seizures - Neurology has signed off and recommended further outpatient follow-up - Continue home meds  - Carbamazepine  500mg  BID  - Lamictal  150mg  BID - Ativan  1mg  PRN for seizures lasting >5 minutes.

## 2024-01-17 ENCOUNTER — Other Ambulatory Visit (HOSPITAL_COMMUNITY): Payer: Self-pay

## 2024-01-17 DIAGNOSIS — R531 Weakness: Secondary | ICD-10-CM | POA: Diagnosis not present

## 2024-01-17 LAB — BASIC METABOLIC PANEL WITH GFR
Anion gap: 12 (ref 5–15)
BUN: 13 mg/dL (ref 6–20)
CO2: 28 mmol/L (ref 22–32)
Calcium: 9 mg/dL (ref 8.9–10.3)
Chloride: 105 mmol/L (ref 98–111)
Creatinine, Ser: 0.78 mg/dL (ref 0.61–1.24)
GFR, Estimated: >60 mL/min (ref >60)
Glucose, Bld: 92 mg/dL (ref 70–99)
Potassium: 4 mmol/L (ref 3.5–5.1)
Sodium: 145 mmol/L (ref 135–145)

## 2024-01-17 MED ORDER — CHLORHEXIDINE GLUCONATE CLOTH 2 % EX PADS: 6.0000 | MEDICATED_PAD | Freq: Every day | CUTANEOUS | Status: DC

## 2024-01-17 MED ORDER — ENSURE PLUS HIGH PROTEIN PO LIQD: 237.0000 mL | Freq: Every day | ORAL | Status: AC

## 2024-01-17 MED ORDER — CHLORHEXIDINE GLUCONATE CLOTH 2 % EX PADS
6.0000 | MEDICATED_PAD | Freq: Every day | CUTANEOUS | Status: DC
Start: 2024-01-17 — End: 2024-01-17
  Administered 2024-01-17: 6 via TOPICAL

## 2024-01-17 MED ORDER — THIAMINE HCL 100 MG PO TABS
100.0000 mg | ORAL_TABLET | Freq: Every day | ORAL | 0 refills | Status: AC
  Filled 2024-01-17: qty 30, 30d supply, fill #0

## 2024-01-17 NOTE — Assessment & Plan Note (Deleted)
 Afebrile, hemodynamically stable. Bcx continue to show NG @ 3 days. CSFx also with NG @ 2 days.  - Neurology has signed off - Last day of abx today -Continue Tylenol  -Lab holiday

## 2024-01-17 NOTE — Progress Notes (Addendum)
 DISCHARGE NOTE HOME ARCHER MOIST to be discharged Home per MD order. Discussed prescriptions and follow up appointments with the patient. Prescriptions given to patient; medication list explained in detail. Patient verbalized understanding.  Skin clean, dry and intact without evidence of skin break down, no evidence of skin tears noted. IV catheter discontinued intact. Site without signs and symptoms of complications. Dressing and pressure applied. Pt denies pain at the site currently. No complaints noted.  Dis charging with foley See LDA for wounds at discharge  An After Visit Summary (AVS) was printed and given to the patient. TOC meds delivered to room  Patient escorted via personal wheelchair, and discharged home via private auto.  Peyton SHAUNNA Pepper, RN

## 2024-01-17 NOTE — Assessment & Plan Note (Deleted)
 GERD - Protonix  40mg  daily Constipation - Miralax  17g daily Behavioral concerns - Risperdal  2mg  BID

## 2024-01-17 NOTE — Assessment & Plan Note (Deleted)
 Stable, no new seizures. - Neurology consulted, appreciate recommendations (s/o) - Continue home meds  - Carbamazepine  500mg  BID  - Lamictal  150mg  BID - Ativan  1mg  PRN for seizures lasting >5 minutes.

## 2024-01-17 NOTE — Discharge Summary (Addendum)
 Family Medicine Teaching [facility] Discharge Summary  Patient name: Kent Peterson Medical record number: [id number] Date of birth: 06-05-1990 Age: 33 y.o. Gender: male Date of Admission: 01/13/2024  Date of Discharge: 01/17/24 Admitting Physician: Twyla Nearing, MD  Primary Care Provider: Vernadine Charlie ORN, MD Consultants: Neurology  Indication for Hospitalization: Sepsis, seizure  Discharge Diagnoses/Problem List:  Principal Problem for Admission: Aspiration pneumonia Other Problems addressed during stay:  Principal Problem:   Generalized weakness Active Problems:   Seizure disorder (HCC)   Chronic static encephalopathy   Hypothermia   Chronic health problem   Severe sepsis (HCC)   Aspiration pneumonia [facility])  Brief Hospital Course:  Kent Peterson is a 33 y.o. male with PMH of chronic static encephalopathy, epilepsy admitted to the [facility] family medicine teaching service for generalized weakness and seizure initially meeting sepsis criteria thought to be secondary to aspiration pneumonia.  His hospital course is detailed below:  Sepsis Aspiration Pneumonia Mother brought patient in after noticing increased lethargy and weakness.  Seizure noted in the ED, that resolved with Ativan  administration, despite years of no seizures with antiepileptic medications.  Patient hypothermic and bradycardic unclear source of infection. In the ED, IV Flagyl  (10/21) and Cefepime  (10/21) administered, Cefepime  discontinued given history of epilepsy. IV vancomycin  (10/21-10/23, acyclovir (10/22-10/23), Ceftriaxone  (10/22-10/23) for empiric meningitis coverage.  Neurology consulted, and performed lumbar puncture. CSF and blood cultures without growth.  Acyclovir was discontinued. Encephalopathy labs collected and negative though Vitamin B1 is pending at the time of discharge. Will update if they return positive. Initial presentation likely from aspiration pneumonia, no evidence of  meningitis. Patient returned back to his baseline at time of discharge.  Seizure, Epilepsy 1 seizure in the ED.  Resolved with Ativan .  Neurology ordered 1000 mg Keppra one-time.  Patient was resumed back on his home meds and had no more seizures this admission.  EEG was done this admission which showed encephalopathy but no seizure or epileptiform discharges.  MRI brain was done on this admission as well which showed no acute intracranial abnormality.  Urinary Retention Patient found to be retaining urine overnight 10/24. Bladder scan showed 358 mL. Completed I&O x2 with eventual foley placement. Patient was discharged with foley in place and recommendation to follow up with urology outpatient for a void trial.   Other chronic conditions were medically managed with home medications and formulary alternatives as necessary (GERD, constipation, behavioral concerns)  PCP Follow-up recommendations: - Ensure follow up with Neurology - Referral to Urology for void trial   Results/Tests Pending at Time of Discharge:  Unresulted Labs (From admission, onward)     Start     Ordered   01/14/24 1615  Vitamin B1  Once,   R        01/14/24 1628             Disposition: Home  Discharge Condition: Stable  Discharge Exam:  Vitals:   01/17/24 0037 01/17/24 0545  BP: 95/67 110/79  Pulse: 66 (!) 53  Resp: 16 18  Temp: (!) 97.5 F (36.4 C) 98.2 F (36.8 C)  SpO2: 96% 98%   General: Well-appearing male in NAD.  Cardiovascular: RRR, no m/r/g appreciated. Pulmonary: Normal WOB. CTAB with no w/c/r present. Abdomen: Soft, non-tender, non-distended. Extremities: Warm and well-perfused, without cyanosis or edema.  Significant Procedures:  DG FL GUIDED LUMBAR PUNCTURE Result Date: 01/16/2024 Technically successful lumbar puncture under fluoroscopy. This exam was performed by [facility] PA-C, and was supervised and  interpreted by Dr. ONEIDA Specking.  Significant Labs and Imaging:  Recent  Labs  Lab 01/16/24 0359  WBC 4.5  HGB 9.9*  HCT 29.2*  PLT 131*   Recent Labs  Lab 01/17/24 0630  NA 145  K 4.0  CL 105  CO2 28  GLUCOSE 92  BUN 13  CREATININE 0.78  CALCIUM 9.0    Pertinent Imaging   MR BRAIN W WO CONTRAST Result Date: 01/16/2024 1. No acute intracranial abnormality.  2. Unchanged extensive encephalomalacia in the cerebral hemispheres bilaterally.  Overnight EEG with video Result Date: 01/15/2024 This study is suggestive of generalized cerebral dysfunction (encephalopathy). No seizures or epileptiform discharges were seen throughout the recording.   CT ABDOMEN PELVIS W CONTRAST Result Date: 01/14/2024 1. New nodular airspace disease/consolidation at the posterior left lung base and, to a lesser degree, the right lung base, suggestive of aspiration pneumonitis versus bronchopneumonia.  2. High-density layering material layering dependently within the bladder, suggestive of debris.  EEG adult Result Date: 01/14/2024 This study is suggestive of generalized cerebral dysfunction (encephalopathy). No seizures or epileptiform discharges were seen throughout the recording.  DG Knee 1-2 Views Right Result Date: 01/14/2024 1. Soft tissue edema anteriorly. No acute osseous abnormality.  2. Technically limited by positioning.  DG Knee 1-2 Views Left Result Date: 01/14/2024 No acute osseous abnormality.  CT Head Wo Contrast Result Date: 01/13/2024 1. No acute intracranial abnormality.  2. Unchanged extensive encephalomalacia in both cerebral hemispheres.   DG Chest 2 View Result Date: 01/13/2024 1. No acute cardiopulmonary process.    Discharge Medications:  Allergies as of 01/17/2024       Reactions   Latex Other (See Comments)   Not allergic        Medication List     TAKE these medications    carbamazepine  100 MG 12 hr capsule Commonly known as: CARBATROL  Take 1 capsule (100 mg total) by mouth 2 (two) times daily.   carbamazepine   200 MG 12 hr capsule Commonly known as: CARBATROL  Take 2 capsules (400 mg total) by mouth 2 (two) times daily.   feeding supplement Liqd Take 237 mLs by mouth daily.   ferrous sulfate  325 (65 FE) MG tablet Take 1 tablet (325 mg total) by mouth every other day. What changed: when to take this   lamoTRIgine  150 MG tablet Commonly known as: LAMICTAL  Take 1 tablet (150 mg total) by mouth 2 (two) times daily.   loratadine 10 MG tablet Commonly known as: CLARITIN Take 10 mg by mouth daily as needed for allergies.   LORazepam  1 MG tablet Commonly known as: ATIVAN  TAKE 1 TABLET BY MOUTH AT BEDTIME AS NEEDED FOR ANXIETY What changed: See the new instructions.   magnesium gluconate 500 MG tablet Commonly known as: MAGONATE Take 500 mg by mouth daily.   mupirocin ointment 2 % Commonly known as: BACTROBAN Apply 1 Application topically daily.   pantoprazole  40 MG tablet Commonly known as: PROTONIX  Take 40 mg by mouth daily.   polyethylene glycol 17 g packet Commonly known as: MiraLax  Take 17 g by mouth daily. What changed:  when to take this reasons to take this   PRENATAL PO Take 1 tablet by mouth daily. Unknown brand/dosage   risperiDONE  2 MG tablet Commonly known as: RISPERDAL  Take 2 mg by mouth 2 (two) times daily.   thiamine 100 MG tablet Commonly known as: VITAMIN B1 Take 1 tablet (100 mg total) by mouth daily.        Discharge  Instructions: Please refer to Patient Instructions section of EMR for full details.  Patient was counseled important signs and symptoms that should prompt return to medical care, changes in medications, dietary instructions, activity restrictions, and follow up appointments.   Follow-Up Appointments:   Jerrie Gathers, DO 01/17/2024, 10:51 AM PGY-1, [place] [facility]   UL Attestation I have reviewed this discharge summary and attest that the information is correct to the best of my knowledge and corresponds to the care  provided by our team.   Lucie Pinal, DO PGY-2, Family Medicine

## 2024-01-17 NOTE — Discharge Instructions (Signed)
 Dear Kent Peterson,   Thank you for letting us  participate in your care! In this section, you will find a brief hospital admission summary of why you were admitted to the hospital, what happened during your admission, your diagnosis/diagnoses, and recommended follow up.   You were admitted for sepsis and aspiration pneumonia which was treated for antibiotics. We finished your course while in the hospital so you do not have to take any more antibiotics at home. You also had a seizure which we believe was from the infection.    POST-HOSPITAL & CARE INSTRUCTIONS We recommend following up with your PCP within 1 week from being discharged from the hospital. Please let PCP/Specialists know of any changes in medications that were made which you will be able to see in the medications section of this packet. Please also follow up Neurology and Urology (specialists as prescribed)  DOCTOR'S APPOINTMENTS & FOLLOW UP Future Appointments  Date Time Provider Department Center  03/22/2024 11:15 AM Gregg Lek, MD [facility] None     Thank you for choosing [facility]! Take care and be well!  Family Medicine Teaching Service Inpatient Team [place]  [facility]  [address] [place], [name] [address] [phone]

## 2024-01-17 NOTE — Progress Notes (Signed)
 Pt continues to be unable to void despite attempts. Bladder scan = 358 ml. Pt previously straight catheterized x 2 with persistent urinary retention. MD notified and order received to insert indwelling foley catheter. Foley catheter inserted per protocol; 450 ml yellow cloudy urine returned. Pt tolerated procedure well.

## 2024-01-18 LAB — CSF CULTURE W GRAM STAIN
Culture: NO GROWTH
Gram Stain: NONE SEEN

## 2024-01-18 LAB — CULTURE, BLOOD (ROUTINE X 2)
Culture: NO GROWTH
Culture: NO GROWTH

## 2024-01-18 LAB — VITAMIN B1: Vitamin B1 (Thiamine): 132.3 nmol/L (ref 66.5–200.0)

## 2024-01-30 ENCOUNTER — Emergency Department (HOSPITAL_COMMUNITY)

## 2024-01-30 ENCOUNTER — Emergency Department (HOSPITAL_COMMUNITY)
Admission: EM | Admit: 2024-01-30 | Discharge: 2024-01-31 | Disposition: A | Discharge status: Home / Self Care | Attending: Emergency Medicine | Admitting: Emergency Medicine

## 2024-01-30 DIAGNOSIS — R509 Fever, unspecified: Secondary | ICD-10-CM | POA: Diagnosis present

## 2024-01-30 DIAGNOSIS — R339 Retention of urine, unspecified: Secondary | ICD-10-CM | POA: Insufficient documentation

## 2024-01-30 DIAGNOSIS — Z91040 Latex allergy status: Secondary | ICD-10-CM | POA: Insufficient documentation

## 2024-01-30 NOTE — ED Triage Notes (Signed)
 PT arrived from home via POV with mom. Per mom pt has not voided since 0800 on 01/30/2024. Per mom pt also had temp of 100 today. Per mom pt was recently in hospital.

## 2024-01-30 NOTE — ED Provider Notes (Signed)
 [place] EMERGENCY DEPARTMENT AT [facility] Provider Note   CSN: [id number] Arrival date & time: 01/30/24  2138     Patient presents with: Urinary Retention and Fever   Kent Peterson is a 33 y.o. male with chronic static encephalopathy who was recently admitted to the hospital for severe sepsis with aspiration pneumonia who presents with his parents today with concern for outpatient fever noted for the last 48 hours with Tmax of 100.6.  Patient's parents do report that this was obtained with a infrared thermometer for the forehead that was actually utilized on the patient's abdomen at home.  Tylenol  around 6:30 PM on 01/30/2024. Per parents patient has been eating and drinking more than normal the last few days, had his Foley catheter (discharged with Foley secondary to urinary retention at recent admission) removed approximately 72 hours ago and without difficulty urinating since that time.  They present primarily this evening due to fever and concern that patient has not voided in approximately 15 hours; last urine output was 8 AM on 11/7.  They state that the patient's activity and energy are normal and he is at his baseline mental status.  He was seen yesterday in the outpatient practice and initiated on oral antibiotics with oral Levaquin per patient's parents.   HPI     Prior to Admission medications   Medication Sig Start Date End Date Taking? Authorizing Provider  carbamazepine  (CARBATROL ) 100 MG 12 hr capsule Take 1 capsule (100 mg total) by mouth 2 (two) times daily. 06/30/23   Millikan, Megan, NP  carbamazepine  (CARBATROL ) 200 MG 12 hr capsule Take 2 capsules (400 mg total) by mouth 2 (two) times daily. 06/30/23   Millikan, Megan, NP  feeding supplement (ENSURE PLUS HIGH PROTEIN) LIQD Take 237 mLs by mouth daily. 01/17/24   Cleotilde Lukes, DO  ferrous sulfate  325 (65 FE) MG tablet Take 1 tablet (325 mg total) by mouth every other day. Patient taking differently:  Take 325 mg by mouth daily with breakfast. 02/23/23   Perri DELENA Meliton Mickey., MD  lamoTRIgine  (LAMICTAL ) 150 MG tablet Take 1 tablet (150 mg total) by mouth 2 (two) times daily. 12/31/23   Millikan, Megan, NP  loratadine (CLARITIN) 10 MG tablet Take 10 mg by mouth daily as needed for allergies.    [provider]  LORazepam  (ATIVAN ) 1 MG tablet TAKE 1 TABLET BY MOUTH AT BEDTIME AS NEEDED FOR ANXIETY Patient taking differently: Take 1 mg by mouth at bedtime as needed for anxiety. 11/23/18   Jenel Carlin POUR, MD  magnesium gluconate (MAGONATE) 500 MG tablet Take 500 mg by mouth daily.    [provider]  mupirocin ointment (BACTROBAN) 2 % Apply 1 Application topically daily.    [provider]  pantoprazole  (PROTONIX ) 40 MG tablet Take 40 mg by mouth daily. 05/30/21   [provider]  polyethylene glycol (MIRALAX ) 17 g packet Take 17 g by mouth daily. Patient taking differently: Take 17 g by mouth daily as needed for mild constipation. 02/23/23   Perri DELENA Meliton Mickey., MD  Prenatal Vit-Fe Fumarate-FA (PRENATAL PO) Take 1 tablet by mouth daily. Unknown brand/dosage    [provider]  risperiDONE  (RISPERDAL ) 2 MG tablet Take 2 mg by mouth 2 (two) times daily.    [provider]  thiamine (VITAMIN B1) 100 MG tablet Take 1 tablet (100 mg total) by mouth daily. 01/17/24   Cleotilde Lukes, DO    Allergies: Latex    Review of  Systems  Unable to perform ROS: Patient nonverbal    Updated Vital Signs BP (!) 132/99 (BP Location: Right Arm)   Pulse 87   Temp 98.6 F (37 C) (Axillary)   Resp 20   Ht 5' 7 (1.702 m)   Wt 48 kg   SpO2 100%   BMI 16.57 kg/m   Physical Exam Vitals and nursing note reviewed.  Constitutional:      Appearance: He is not ill-appearing or toxic-appearing.  HENT:     Head: Normocephalic and atraumatic.     Mouth/Throat:     Mouth: Mucous membranes are moist.     Pharynx: No oropharyngeal exudate or posterior  oropharyngeal erythema.  Eyes:     General:        Right eye: No discharge.        Left eye: No discharge.     Conjunctiva/sclera: Conjunctivae normal.     Pupils: Pupils are equal, round, and reactive to light.  Cardiovascular:     Rate and Rhythm: Normal rate and regular rhythm.     Pulses: Normal pulses.     Heart sounds: Normal heart sounds. No murmur heard. Pulmonary:     Effort: Pulmonary effort is normal. No respiratory distress.     Breath sounds: Normal breath sounds. No wheezing or rales.  Abdominal:     General: Bowel sounds are normal. There is no distension.     Palpations: Abdomen is soft.     Tenderness: There is no abdominal tenderness. There is no guarding or rebound.  Musculoskeletal:        General: No deformity.     Cervical back: Neck supple.  Skin:    General: Skin is warm and dry.  Neurological:     Mental Status: He is alert. Mental status is at baseline.     Comments: Moving all extremity spontaneously without difficulty.  Psychiatric:        Mood and Affect: Mood normal.     (all labs ordered are listed, but only abnormal results are displayed) Labs Reviewed  CBC WITH DIFFERENTIAL/PLATELET - Abnormal; Notable for the following components:      Result Value   RBC 3.04 (*)    Hemoglobin 10.6 (*)    HCT 31.8 (*)    MCV 104.6 (*)    MCH 34.9 (*)    All other components within normal limits  COMPREHENSIVE METABOLIC PANEL WITH GFR - Abnormal; Notable for the following components:   Glucose, Bld 104 (*)    Creatinine, Ser 0.60 (*)    Albumin 3.4 (*)    All other components within normal limits  URINALYSIS, W/ REFLEX TO CULTURE (INFECTION SUSPECTED) - Abnormal; Notable for the following components:   APPearance HAZY (*)    Bacteria, UA RARE (*)    All other components within normal limits  RESP PANEL BY RT-PCR (RSV, FLU A&B, COVID)  RVPGX2  URINE CULTURE    EKG: None  Radiology: DG Chest Portable 1 View Result Date: 01/31/2024 EXAM: 1  VIEW(S) XRAY OF THE CHEST 01/30/2024 11:57:00 PM COMPARISON: 01/13/2024 CLINICAL HISTORY: fever FINDINGS: LUNGS AND PLEURA: No focal pulmonary opacity. No pulmonary edema. No pleural effusion. No pneumothorax. HEART AND MEDIASTINUM: No acute abnormality of the cardiac and mediastinal silhouettes. BONES AND SOFT TISSUES: No acute osseous abnormality. IMPRESSION: 1. No acute cardiopulmonary process. Electronically signed by: Oneil Devonshire MD 01/31/2024 12:16 AM EST RP Workstation: [id number]     Procedures   Medications Ordered in the ED -  No data to display                                  Medical Decision Making 33 y/o male who presents with concern for possible urinary retention and reported fever at home.  Vital signs reassuring on intake.  Cardiopulmonary exam unremarkable, abdominal exam is benign.  Neurologically at mental status baseline, alert and active in the bed.  Amount and/or Complexity of Data Reviewed Labs: ordered.    Details: CBC with anemia with hemoglobin 10.6.  CMP unremarkable, UA without evidence of infection. RVP is pending at this time. Radiology: ordered.    Details:  Chest x-ray negative for acute cardiopulmonary disease.    Clinically patient is very well-appearing, clinical concern for emergent underlying etiology of his symptoms that [facility] ED workup and patient management is exceedingly low.  Suspect patient did not urinate for several hours secondary to low bladder volume with only 500 cc out when emptying the bladder with In-N-Out catheterization.  Extensive discussion with the patient's parents regarding risks versus benefits of repeat Foley catheter placement; ultimately I do not feel catheter placement is necessary tonight.  Patient's parents strongly prefer to avoid Foley catheterization if possible and I feel this is reasonable given their admirable attentiveness to the care of their son.  Return precautions are given.  Dary's parents voiced understanding  of her medical evaluation and treatment plan. Each of their questions answered to their expressed satisfaction.  Return precautions were given.  Patient is well-appearing, stable, and was discharged in good condition. This chart was dictated using voice recognition software, Dragon. Despite the best efforts of this provider to proofread and correct errors, errors may still occur which can change documentation meaning.      Final diagnoses:  Fever, unspecified fever cause    ED Discharge Orders     None          Bobette Pleasant JONELLE DEVONNA 01/31/24 0240    Patt Alm Macho, MD 01/31/24 1517

## 2024-01-31 ENCOUNTER — Other Ambulatory Visit: Payer: Self-pay

## 2024-01-31 ENCOUNTER — Encounter (HOSPITAL_COMMUNITY): Payer: Self-pay

## 2024-01-31 LAB — COMPREHENSIVE METABOLIC PANEL WITH GFR
ALT: 24 U/L (ref 0–44)
AST: 24 U/L (ref 15–41)
Albumin: 3.4 g/dL — ABNORMAL LOW (ref 3.5–5.0)
Alkaline Phosphatase: 126 U/L (ref 38–126)
Anion gap: 11 (ref 5–15)
BUN: 14 mg/dL (ref 6–20)
CO2: 28 mmol/L (ref 22–32)
Calcium: 9.1 mg/dL (ref 8.9–10.3)
Chloride: 102 mmol/L (ref 98–111)
Creatinine, Ser: 0.6 mg/dL — ABNORMAL LOW (ref 0.61–1.24)
GFR, Estimated: >60 mL/min (ref >60)
Glucose, Bld: 104 mg/dL — ABNORMAL HIGH (ref 70–99)
Potassium: 3.5 mmol/L (ref 3.5–5.1)
Sodium: 141 mmol/L (ref 135–145)
Total Bilirubin: 0.5 mg/dL (ref 0.0–1.2)
Total Protein: 7 g/dL (ref 6.5–8.1)

## 2024-01-31 LAB — URINALYSIS, W/ REFLEX TO CULTURE (INFECTION SUSPECTED)
Bilirubin Urine: NEGATIVE
Glucose, UA: NEGATIVE mg/dL
Hgb urine dipstick: NEGATIVE
Ketones, ur: NEGATIVE mg/dL
Leukocytes,Ua: NEGATIVE
Nitrite: NEGATIVE
Protein, ur: NEGATIVE mg/dL
Specific Gravity, Urine: 1.021 (ref 1.005–1.030)
pH: 7 (ref 5.0–8.0)

## 2024-01-31 LAB — CBC WITH DIFFERENTIAL/PLATELET
Abs Immature Granulocytes: 0.03 K/uL (ref 0.00–0.07)
Basophils Absolute: 0 K/uL (ref 0.0–0.1)
Basophils Relative: 0 %
Eosinophils Absolute: 0 K/uL (ref 0.0–0.5)
Eosinophils Relative: 0 %
HCT: 31.8 % — ABNORMAL LOW (ref 39.0–52.0)
Hemoglobin: 10.6 g/dL — ABNORMAL LOW (ref 13.0–17.0)
Immature Granulocytes: 1 %
Lymphocytes Relative: 13 %
Lymphs Abs: 0.8 K/uL (ref 0.7–4.0)
MCH: 34.9 pg — ABNORMAL HIGH (ref 26.0–34.0)
MCHC: 33.3 g/dL (ref 30.0–36.0)
MCV: 104.6 fL — ABNORMAL HIGH (ref 80.0–100.0)
Monocytes Absolute: 0.6 K/uL (ref 0.1–1.0)
Monocytes Relative: 10 %
Neutro Abs: 4.9 K/uL (ref 1.7–7.7)
Neutrophils Relative %: 76 %
Platelets: 300 K/uL (ref 150–400)
RBC: 3.04 MIL/uL — ABNORMAL LOW (ref 4.22–5.81)
RDW: 13.6 % (ref 11.5–15.5)
WBC: 6.3 K/uL (ref 4.0–10.5)
nRBC: 0 % (ref 0.0–0.2)

## 2024-01-31 LAB — RESP PANEL BY RT-PCR (RSV, FLU A&B, COVID)  RVPGX2
Influenza A by PCR: NEGATIVE
Influenza B by PCR: NEGATIVE
Resp Syncytial Virus by PCR: NEGATIVE
SARS Coronavirus 2 by RT PCR: NEGATIVE

## 2024-01-31 NOTE — Discharge Instructions (Addendum)
 Kent Peterson's workup today was very reassuring. Please continue to follow up with his outpatient team as scheduled, continue his oral antibiotics as previously prescribed, and return to the ER with any new severe sympomts.

## 2024-02-01 LAB — URINE CULTURE: Culture: NO GROWTH

## 2024-02-04 LAB — CULTURE, FUNGUS WITHOUT SMEAR

## 2024-03-14 ENCOUNTER — Inpatient Hospital Stay (HOSPITAL_COMMUNITY)
Admission: EM | Admit: 2024-03-14 | Discharge: 2024-03-17 | Disposition: A | Discharge status: Home / Self Care | Attending: Family Medicine | Admitting: Family Medicine

## 2024-03-14 ENCOUNTER — Encounter (HOSPITAL_COMMUNITY): Payer: Self-pay

## 2024-03-14 ENCOUNTER — Emergency Department (HOSPITAL_COMMUNITY)

## 2024-03-14 ENCOUNTER — Other Ambulatory Visit: Payer: Self-pay

## 2024-03-14 DIAGNOSIS — Z91040 Latex allergy status: Secondary | ICD-10-CM | POA: Diagnosis not present

## 2024-03-14 DIAGNOSIS — Z8249 Family history of ischemic heart disease and other diseases of the circulatory system: Secondary | ICD-10-CM | POA: Diagnosis not present

## 2024-03-14 DIAGNOSIS — G9349 Other encephalopathy: Secondary | ICD-10-CM | POA: Diagnosis present

## 2024-03-14 DIAGNOSIS — A419 Sepsis, unspecified organism: Secondary | ICD-10-CM | POA: Diagnosis not present

## 2024-03-14 DIAGNOSIS — T68XXXA Hypothermia, initial encounter: Principal | ICD-10-CM | POA: Diagnosis present

## 2024-03-14 DIAGNOSIS — G40909 Epilepsy, unspecified, not intractable, without status epilepticus: Secondary | ICD-10-CM | POA: Diagnosis present

## 2024-03-14 DIAGNOSIS — Z833 Family history of diabetes mellitus: Secondary | ICD-10-CM

## 2024-03-14 DIAGNOSIS — Z1152 Encounter for screening for COVID-19: Secondary | ICD-10-CM | POA: Diagnosis not present

## 2024-03-14 DIAGNOSIS — K219 Gastro-esophageal reflux disease without esophagitis: Secondary | ICD-10-CM | POA: Diagnosis present

## 2024-03-14 DIAGNOSIS — R001 Bradycardia, unspecified: Secondary | ICD-10-CM | POA: Diagnosis present

## 2024-03-14 DIAGNOSIS — J690 Pneumonitis due to inhalation of food and vomit: Principal | ICD-10-CM | POA: Diagnosis present

## 2024-03-14 DIAGNOSIS — B999 Unspecified infectious disease: Secondary | ICD-10-CM | POA: Diagnosis present

## 2024-03-14 DIAGNOSIS — R339 Retention of urine, unspecified: Secondary | ICD-10-CM

## 2024-03-14 DIAGNOSIS — Z79899 Other long term (current) drug therapy: Secondary | ICD-10-CM

## 2024-03-14 DIAGNOSIS — J189 Pneumonia, unspecified organism: Secondary | ICD-10-CM | POA: Diagnosis present

## 2024-03-14 DIAGNOSIS — Z83438 Family history of other disorder of lipoprotein metabolism and other lipidemia: Secondary | ICD-10-CM | POA: Diagnosis not present

## 2024-03-14 DIAGNOSIS — F39 Unspecified mood [affective] disorder: Secondary | ICD-10-CM | POA: Diagnosis present

## 2024-03-14 DIAGNOSIS — K760 Fatty (change of) liver, not elsewhere classified: Secondary | ICD-10-CM | POA: Diagnosis present

## 2024-03-14 LAB — COMPREHENSIVE METABOLIC PANEL WITH GFR
ALT: 88 U/L — ABNORMAL HIGH (ref 0–44)
AST: 68 U/L — ABNORMAL HIGH (ref 15–41)
Albumin: 4.4 g/dL (ref 3.5–5.0)
Alkaline Phosphatase: 147 U/L — ABNORMAL HIGH (ref 38–126)
Anion gap: 9 (ref 5–15)
BUN: 29 mg/dL — ABNORMAL HIGH (ref 6–20)
CO2: 31 mmol/L (ref 22–32)
Calcium: 9.8 mg/dL (ref 8.9–10.3)
Chloride: 105 mmol/L (ref 98–111)
Creatinine, Ser: 0.75 mg/dL (ref 0.61–1.24)
GFR, Estimated: >60 mL/min
Glucose, Bld: 72 mg/dL (ref 70–99)
Potassium: 4.6 mmol/L (ref 3.5–5.1)
Sodium: 145 mmol/L (ref 135–145)
Total Bilirubin: <0.2 mg/dL (ref 0.0–1.2)
Total Protein: 7 g/dL (ref 6.5–8.1)

## 2024-03-14 LAB — CBC WITH DIFFERENTIAL/PLATELET
Abs Immature Granulocytes: 0 K/uL (ref 0.00–0.07)
Basophils Absolute: 0 K/uL (ref 0.0–0.1)
Basophils Relative: 1 %
Eosinophils Absolute: 0 K/uL (ref 0.0–0.5)
Eosinophils Relative: 2 %
HCT: 38.2 % — ABNORMAL LOW (ref 39.0–52.0)
Hemoglobin: 12.9 g/dL — ABNORMAL LOW (ref 13.0–17.0)
Immature Granulocytes: 0 %
Lymphocytes Relative: 27 %
Lymphs Abs: 0.5 K/uL — ABNORMAL LOW (ref 0.7–4.0)
MCH: 34.5 pg — ABNORMAL HIGH (ref 26.0–34.0)
MCHC: 33.8 g/dL (ref 30.0–36.0)
MCV: 102.1 fL — ABNORMAL HIGH (ref 80.0–100.0)
Monocytes Absolute: 0.2 K/uL (ref 0.1–1.0)
Monocytes Relative: 8 %
Neutro Abs: 1.2 K/uL — ABNORMAL LOW (ref 1.7–7.7)
Neutrophils Relative %: 62 %
Platelets: 113 K/uL — ABNORMAL LOW (ref 150–400)
RBC: 3.74 MIL/uL — ABNORMAL LOW (ref 4.22–5.81)
RDW: 12.1 % (ref 11.5–15.5)
WBC: 1.9 K/uL — ABNORMAL LOW (ref 4.0–10.5)
nRBC: 0 % (ref 0.0–0.2)

## 2024-03-14 LAB — URINALYSIS, ROUTINE W REFLEX MICROSCOPIC
Bilirubin Urine: NEGATIVE
Glucose, UA: NEGATIVE mg/dL
Hgb urine dipstick: NEGATIVE
Ketones, ur: NEGATIVE mg/dL
Leukocytes,Ua: NEGATIVE
Nitrite: NEGATIVE
Protein, ur: NEGATIVE mg/dL
Specific Gravity, Urine: 1.01 (ref 1.005–1.030)
pH: 7 (ref 5.0–8.0)

## 2024-03-14 LAB — RESP PANEL BY RT-PCR (RSV, FLU A&B, COVID)  RVPGX2
Influenza A by PCR: NEGATIVE
Influenza B by PCR: NEGATIVE
Resp Syncytial Virus by PCR: NEGATIVE
SARS Coronavirus 2 by RT PCR: NEGATIVE

## 2024-03-14 LAB — I-STAT CG4 LACTIC ACID, ED: Lactic Acid, Venous: <0.3 mmol/L — ABNORMAL LOW (ref 0.5–1.9)

## 2024-03-14 LAB — CBG MONITORING, ED: Glucose-Capillary: 74 mg/dL (ref 70–99)

## 2024-03-14 MED ORDER — IOHEXOL 350 MG/ML SOLN
60.0000 mL | Freq: Once | INTRAVENOUS | Status: AC | PRN
  Administered 2024-03-14: 60 mL via INTRAVENOUS

## 2024-03-14 MED ORDER — LACTATED RINGERS IV SOLN: INTRAVENOUS | Status: AC

## 2024-03-14 MED ORDER — SODIUM CHLORIDE 0.9 % IV BOLUS: 1000.0000 mL | Freq: Once | INTRAVENOUS | Status: DC

## 2024-03-14 MED ORDER — METRONIDAZOLE 500 MG/100ML IV SOLN
500.0000 mg | Freq: Once | INTRAVENOUS | Status: AC
  Administered 2024-03-14: 500 mg via INTRAVENOUS
  Filled 2024-03-14: qty 100

## 2024-03-14 MED ORDER — SODIUM CHLORIDE 0.9 % IV SOLN
2.0000 g | Freq: Once | INTRAVENOUS | Status: AC
  Administered 2024-03-14: 2 g via INTRAVENOUS
  Filled 2024-03-14: qty 12.5

## 2024-03-14 MED ORDER — LACTATED RINGERS IV BOLUS (SEPSIS)
1000.0000 mL | Freq: Once | INTRAVENOUS | Status: AC
  Administered 2024-03-14: 1000 mL via INTRAVENOUS

## 2024-03-14 MED ORDER — VANCOMYCIN HCL IN DEXTROSE 1-5 GM/200ML-% IV SOLN
1000.0000 mg | Freq: Once | INTRAVENOUS | Status: AC
  Administered 2024-03-14: 1000 mg via INTRAVENOUS
  Filled 2024-03-14: qty 200

## 2024-03-14 NOTE — ED Notes (Signed)
 Bair hugger placed on pt on high for rectal temp 92

## 2024-03-14 NOTE — ED Triage Notes (Signed)
 Pt parents report pt has been more lethargic today. Nonverbal at baseline, decreased appetite today. Hx of frequent infections

## 2024-03-14 NOTE — ED Triage Notes (Signed)
 Pt arrives via POV with parents. He is nonverbal and wheelchair bound, hx of static encephalopathy. Parents reports pt has been lethargic over the past couple of days. States it was very difficult to wake him up earlier today. Unable to get a oral or axillary temp at this time. Parents state he has been hypothermic in the past. Pt is pale and cool to the touch.

## 2024-03-14 NOTE — Sepsis Progress Note (Signed)
 Elink following for sepsis protocol.

## 2024-03-14 NOTE — ED Provider Notes (Signed)
 " [place] EMERGENCY DEPARTMENT AT [facility] Provider Note   CSN: [id number] Arrival date & time: 03/14/24  1756     Patient presents with: Lethargic and Nasal Congestion   Kent Peterson is a 33 y.o. male.   33 year old male with history of static encephalopathy presenting accompanied by his parents for lethargy.  Patient's parents report that he is nonverbal at baseline, they are his full-time caregivers and report that over the last 3 days he has become more lethargic, he typically does not transfer/ambulate on his own but he typically can rise from a seated to standing position, he has not been able to do this for several days.  He has had diminished appetite but did eat breakfast this morning and was able to drink water, he has not passed any urine since early this morning and has not had a bowel movement since Friday, which is typical for him.  When his parents went to check his vitals at home they were unable to detect an accurate reading for his temperature and he was found to be bradycardic.  Patient was previously hospitalized for sepsis in October, this was believed to be attributed to aspiration pneumonia, patient has not had any witnessed aspiration events at home.  History of grand mal seizures, patient's parents have not been aware of any seizure recurrence.  He had a mild runny nose earlier in the week but this resolved, no cough or other respiratory symptoms. Parent's report he typically does not respond to pain.        Prior to Admission medications  Medication Sig Start Date End Date Taking? Authorizing Provider  carbamazepine  (CARBATROL ) 100 MG 12 hr capsule Take 1 capsule (100 mg total) by mouth 2 (two) times daily. 06/30/23   Sherryl Bouchard, NP  carbamazepine  (CARBATROL ) 200 MG 12 hr capsule Take 2 capsules (400 mg total) by mouth 2 (two) times daily. 06/30/23   Millikan, Megan, NP  feeding supplement (ENSURE PLUS HIGH PROTEIN) LIQD Take 237 mLs by mouth  daily. 01/17/24   Cleotilde Lukes, DO  ferrous sulfate  325 (65 FE) MG tablet Take 1 tablet (325 mg total) by mouth every other day. Patient taking differently: Take 325 mg by mouth daily with breakfast. 02/23/23   Perri DELENA Meliton Mickey., MD  lamoTRIgine  (LAMICTAL ) 150 MG tablet Take 1 tablet (150 mg total) by mouth 2 (two) times daily. 12/31/23   Millikan, Megan, NP  loratadine (CLARITIN) 10 MG tablet Take 10 mg by mouth daily as needed for allergies.    [provider]  LORazepam  (ATIVAN ) 1 MG tablet TAKE 1 TABLET BY MOUTH AT BEDTIME AS NEEDED FOR ANXIETY Patient taking differently: Take 1 mg by mouth at bedtime as needed for anxiety. 11/23/18   Jenel Carlin POUR, MD  magnesium  gluconate (MAGONATE) 500 MG tablet Take 500 mg by mouth daily.    [provider]  mupirocin ointment (BACTROBAN) 2 % Apply 1 Application topically daily.    [provider]  pantoprazole  (PROTONIX ) 40 MG tablet Take 40 mg by mouth daily. 05/30/21   [provider]  polyethylene glycol (MIRALAX ) 17 g packet Take 17 g by mouth daily. Patient taking differently: Take 17 g by mouth daily as needed for mild constipation. 02/23/23   Perri DELENA Meliton Mickey., MD  Prenatal Vit-Fe Fumarate-FA (PRENATAL PO) Take 1 tablet by mouth daily. Unknown brand/dosage    [provider]  risperiDONE  (RISPERDAL ) 2 MG tablet Take 2 mg by mouth 2 (two) times  daily.    [provider]  thiamine  (VITAMIN B1) 100 MG tablet Take 1 tablet (100 mg total) by mouth daily. 01/17/24   Cleotilde Lukes, DO    Allergies: Latex    Review of Systems  Updated Vital Signs  Vitals:   03/14/24 2115 03/14/24 2130 03/14/24 2145 03/14/24 2204  BP: 100/66 119/82 104/79   Pulse: 70 74 70   Resp: 20 18 (!) 21   Temp:    (!) 95.3 F (35.2 C)  TempSrc:    Rectal  SpO2: 99% 100% 99%      Physical Exam Vitals and nursing note reviewed.  Constitutional:      Comments: Lifts his head and looks around the room   HENT:     Head: Atraumatic.  Eyes:     Extraocular Movements: Extraocular movements intact.  Cardiovascular:     Rate and Rhythm: Normal rate and regular rhythm.  Pulmonary:     Effort: Pulmonary effort is normal.     Breath sounds: Normal breath sounds.  Abdominal:     Palpations: Abdomen is soft.     Tenderness: There is no abdominal tenderness. There is no guarding.  Musculoskeletal:     Cervical back: Neck supple.     Comments: Contracted extremities  Skin:    Comments: Cool to the touch  Neurological:     Mental Status: Mental status is at baseline.     Comments: Nonverbal at baseline     (all labs ordered are listed, but only abnormal results are displayed) Labs Reviewed  CBC WITH DIFFERENTIAL/PLATELET - Abnormal; Notable for the following components:      Result Value   WBC 1.9 (*)    RBC 3.74 (*)    Hemoglobin 12.9 (*)    HCT 38.2 (*)    MCV 102.1 (*)    MCH 34.5 (*)    Platelets 113 (*)    Neutro Abs 1.2 (*)    Lymphs Abs 0.5 (*)    All other components within normal limits  COMPREHENSIVE METABOLIC PANEL WITH GFR - Abnormal; Notable for the following components:   BUN 29 (*)    AST 68 (*)    ALT 88 (*)    Alkaline Phosphatase 147 (*)    All other components within normal limits  URINALYSIS, ROUTINE W REFLEX MICROSCOPIC - Abnormal; Notable for the following components:   APPearance CLOUDY (*)    All other components within normal limits  I-STAT CG4 LACTIC ACID, ED - Abnormal; Notable for the following components:   Lactic Acid, Venous <0.3 (*)    All other components within normal limits  RESP PANEL BY RT-PCR (RSV, FLU A&B, COVID)  RVPGX2  CULTURE, BLOOD (ROUTINE X 2)  CULTURE, BLOOD (ROUTINE X 2)  CBG MONITORING, ED  I-STAT CG4 LACTIC ACID, ED    EKG: None  Radiology: CT CHEST ABDOMEN PELVIS W CONTRAST Result Date: 03/14/2024 EXAM: CT CHEST, ABDOMEN AND PELVIS WITH CONTRAST 03/14/2024 09:43:04 PM TECHNIQUE: CT of the chest, abdomen and pelvis  was performed with the administration of 60 mL of iohexol  (OMNIPAQUE ) 350 MG/ML injection. Multiplanar reformatted images are provided for review. Automated exposure control, iterative reconstruction, and/or weight based adjustment of the mA/kV was utilized to reduce the radiation dose to as low as reasonably achievable. COMPARISON: None available. CLINICAL HISTORY: Undifferentiated sepsis, history of aspiration PNA, hypothermic. FINDINGS: CHEST: MEDIASTINUM AND LYMPH NODES: Heart and pericardium are unremarkable. The central airways are clear. No mediastinal, hilar or axillary lymphadenopathy.  LUNGS AND PLEURA: There are a few clustered centrilobular micronodules in the medial right lower lobe compatible with mild small airway infection / inflammation. The lungs are otherwise clear. No pleural effusion or pneumothorax. ABDOMEN AND PELVIS: LIVER: The liver is unremarkable. GALLBLADDER AND BILE DUCTS: Gallbladder is unremarkable. No biliary ductal dilatation. SPLEEN: No acute abnormality. PANCREAS: No acute abnormality. ADRENAL GLANDS: No acute abnormality. KIDNEYS, URETERS AND BLADDER: No stones in the kidneys or ureters. No hydronephrosis. No perinephric or periureteral stranding. Distended bladder. Redemonstrated hyperdense layering debris in the bladder measuring up to 7 mm. GI AND BOWEL: Stomach demonstrates no acute abnormality. There is no bowel obstruction. Moderate colonic stool burden compatible with constipation. REPRODUCTIVE ORGANS: No acute abnormality. PERITONEUM AND RETROPERITONEUM: No ascites. No free air. VASCULATURE: Aorta is normal in caliber. ABDOMINAL AND PELVIS LYMPH NODES: No lymphadenopathy. REPRODUCTIVE ORGANS: No acute abnormality. BONES AND SOFT TISSUES: Postoperative changes on femur. No acute osseous abnormality. No focal soft tissue abnormality. IMPRESSION: 1. Mild small airway infection/inflammation in the medial right lower lobe. 2. Constipation. 3. Distended bladder with layering  debris. Electronically signed by: Norman Gatlin MD 03/14/2024 10:03 PM EST RP Workstation: [id number]   DG Chest Portable 1 View Result Date: 03/14/2024 CLINICAL DATA:  Lethargy. EXAM: PORTABLE CHEST 1 VIEW COMPARISON:  01/30/2024 FINDINGS: The cardiomediastinal contours are normal. The lungs are clear. Pulmonary vasculature is normal. No consolidation, pleural effusion, or pneumothorax. No acute osseous abnormalities are seen. IMPRESSION: No active disease. Electronically Signed   By: Andrea Gasman M.D.   On: 03/14/2024 19:26     Procedures   Medications Ordered in the ED  lactated ringers  infusion ( Intravenous New Bag/Given 03/14/24 2103)  vancomycin  (VANCOCIN ) IVPB 1000 mg/200 mL premix (1,000 mg Intravenous New Bag/Given 03/14/24 2324)  lactated ringers  bolus 1,000 mL (0 mLs Intravenous Stopped 03/14/24 2055)  ceFEPIme  (MAXIPIME ) 2 g in sodium chloride  0.9 % 100 mL IVPB (0 g Intravenous Stopped 03/14/24 2029)  metroNIDAZOLE  (FLAGYL ) IVPB 500 mg (0 mg Intravenous Stopped 03/14/24 2203)  iohexol  (OMNIPAQUE ) 350 MG/ML injection 60 mL (60 mLs Intravenous Contrast Given 03/14/24 2143)                                    Medical Decision Making This patient presents to the ED for concern of lethargy/hypothermia, this involves an extensive number of treatment options, and is a complaint that carries with it a high risk of complications and morbidity.  The differential diagnosis includes COVID/Flu/RSV, PNA, UTI, sepsis   Co morbidities that complicate the patient evaluation  Static encephalopathy, h/o seizure disorder   Additional history obtained:  Additional history obtained from record review External records from outside source obtained and reviewed including prior ED note / hospital discharge summary   Lab Tests:  I Ordered, and personally interpreted labs.  The pertinent results include: CBC with leukopenia, white blood cell count of 1.9, hemoglobin of 12.9 is improved  from most recent baseline.  CMP notable for elevations in alk phos/ALT/AST.  COVID/flu/RSV negative. UA unremarkable.  Initial lactic was collected along with other labs however lab was either lost or not received/processed, lactic was then redrawn and found to be low at 0.3.   Imaging Studies ordered:  I ordered imaging studies including CXR, CT chest/abdomen/pelvis I independently visualized and interpreted imaging which showed  - CXR: No active disease. - CT chest/abdomen/pelvis: 1. Mild small airway infection/inflammation in the medial right  lower lobe. 2. Constipation. 3. Distended bladder with layering debris.  I agree with the radiologist interpretation   Cardiac Monitoring: / EKG:  The patient was maintained on a cardiac monitor.  I personally viewed and interpreted the cardiac monitored which showed an underlying rhythm of: NSR   Consultations Obtained:  I requested consultation with the hospitalist,  and discussed lab and imaging findings as well as pertinent plan - they recommend: I spoke with Dr. Dena who agrees that this patient is appropriate for admission.    Problem List / ED Course / Critical interventions / Medication management  I ordered medication including Vanc/cefepime /Flagyl  for undifferentiated sepsis, IVF in accordance with sepsis protocol  Reevaluation of the patient after these medicines showed that the patient improved I have reviewed the patients home medicines and have made adjustments as needed    Test / Admission - Considered:  Patient is nonverbal at baseline and does not respond to pain per parents. At initial assessment, patient appears drowsy and does not lift his head during my assessment. Bear hugger on for hypothermia, with T of 76F on arrival. Bradycardic on arrival. Temp continuing to improve, patient more alert at time of reassessment and is able to raise his head / look around the room, patient's parents note that this is an improvement  from earlier. HR has normalized.  Workup as above is notable for leukopenia and elevations in AST/ALT/alk phos, respiratory panel negative, CT findings suggest small airway infection/inflammation in the medial right lower lobe, this may be representative of aspiration pneumonia as patient has a history of the same. Patient has not produced any urine since arriving to the hospital despite condom catheter remaining in place. CT imaging raises concern for bladder distension, will have nursing staff place temp sensing foley catheter.  I spoke with the hospitalist service who agrees that this patient is appropriate for admission.  Staffed with Dr. Francesca  Amount and/or Complexity of Data Reviewed Labs: ordered. Radiology: ordered.  Risk Prescription drug management. Decision regarding hospitalization.        Final diagnoses:  Hypothermia, initial encounter  Sepsis, due to unspecified organism, unspecified whether acute organ dysfunction present [facility])  Urinary retention  Pneumonia of right lower lobe due to infectious organism    ED Discharge Orders     None          Glendia Rocky LOISE DEVONNA 03/14/24 2337    Francesca Elsie CROME, MD 03/15/24 1309    Francesca Elsie CROME, MD 04/12/24 1356  "

## 2024-03-14 NOTE — ED Notes (Signed)
 Bare Hugger turned off at this time

## 2024-03-15 DIAGNOSIS — B999 Unspecified infectious disease: Secondary | ICD-10-CM | POA: Diagnosis not present

## 2024-03-15 DIAGNOSIS — G9349 Other encephalopathy: Secondary | ICD-10-CM

## 2024-03-15 LAB — PROCALCITONIN: Procalcitonin: <0.1 ng/mL

## 2024-03-15 MED ORDER — RISPERIDONE 0.5 MG PO TABS
2.0000 mg | ORAL_TABLET | Freq: Two times a day (BID) | ORAL | Status: DC
  Administered 2024-03-15 – 2024-03-17 (×5): 2 mg via ORAL
  Filled 2024-03-15: qty 4
  Filled 2024-03-15: qty 1
  Filled 2024-03-15: qty 4
  Filled 2024-03-15: qty 1
  Filled 2024-03-15 (×2): qty 4

## 2024-03-15 MED ORDER — POLYETHYLENE GLYCOL 3350 17 G PO PACK
17.0000 g | PACK | Freq: Every day | ORAL | Status: DC
  Administered 2024-03-15: 17 g via ORAL
  Filled 2024-03-15 (×3): qty 1

## 2024-03-15 MED ORDER — LAMOTRIGINE 100 MG PO TABS
150.0000 mg | ORAL_TABLET | Freq: Two times a day (BID) | ORAL | Status: DC
  Administered 2024-03-15 – 2024-03-17 (×5): 150 mg via ORAL
  Filled 2024-03-15 (×5): qty 2

## 2024-03-15 MED ORDER — LORAZEPAM 1 MG PO TABS: 1.0000 mg | ORAL_TABLET | Freq: Every evening | ORAL | Status: DC | PRN

## 2024-03-15 MED ORDER — CARBAMAZEPINE ER 200 MG PO TB12
400.0000 mg | ORAL_TABLET | Freq: Two times a day (BID) | ORAL | Status: DC
  Administered 2024-03-15 – 2024-03-17 (×5): 400 mg via ORAL
  Filled 2024-03-15 (×6): qty 2

## 2024-03-15 MED ORDER — ENOXAPARIN SODIUM 40 MG/0.4ML IJ SOSY
40.0000 mg | PREFILLED_SYRINGE | INTRAMUSCULAR | Status: DC
  Administered 2024-03-15 – 2024-03-17 (×2): 40 mg via SUBCUTANEOUS
  Filled 2024-03-15 (×2): qty 0.4

## 2024-03-15 MED ORDER — MAGNESIUM OXIDE -MG SUPPLEMENT 400 (240 MG) MG PO TABS
400.0000 mg | ORAL_TABLET | Freq: Every day | ORAL | Status: DC
  Administered 2024-03-15 – 2024-03-17 (×3): 400 mg via ORAL
  Filled 2024-03-15 (×3): qty 1

## 2024-03-15 MED ORDER — PANTOPRAZOLE SODIUM 40 MG PO TBEC
40.0000 mg | DELAYED_RELEASE_TABLET | Freq: Every day | ORAL | Status: DC
  Administered 2024-03-15 – 2024-03-17 (×3): 40 mg via ORAL
  Filled 2024-03-15 (×3): qty 1

## 2024-03-15 MED ORDER — PROMETHAZINE HCL 25 MG PO TABS: 12.5000 mg | ORAL_TABLET | Freq: Four times a day (QID) | ORAL | Status: DC | PRN

## 2024-03-15 MED ORDER — ACETAMINOPHEN 325 MG PO TABS
650.0000 mg | ORAL_TABLET | Freq: Four times a day (QID) | ORAL | Status: DC | PRN
  Filled 2024-03-15: qty 2

## 2024-03-15 MED ORDER — SODIUM CHLORIDE 0.9 % IV SOLN
3.0000 g | Freq: Three times a day (TID) | INTRAVENOUS | Status: DC
  Administered 2024-03-15 – 2024-03-17 (×8): 3 g via INTRAVENOUS
  Filled 2024-03-15 (×8): qty 8

## 2024-03-15 MED ORDER — CHLORHEXIDINE GLUCONATE CLOTH 2 % EX PADS
6.0000 | MEDICATED_PAD | Freq: Every day | CUTANEOUS | Status: DC
  Administered 2024-03-15 – 2024-03-17 (×2): 6 via TOPICAL

## 2024-03-15 MED ORDER — CARBAMAZEPINE ER 100 MG PO TB12
100.0000 mg | ORAL_TABLET | Freq: Two times a day (BID) | ORAL | Status: DC
  Administered 2024-03-15 – 2024-03-17 (×5): 100 mg via ORAL
  Filled 2024-03-15 (×6): qty 1

## 2024-03-15 MED ORDER — THIAMINE MONONITRATE 100 MG PO TABS
100.0000 mg | ORAL_TABLET | Freq: Every day | ORAL | Status: DC
  Administered 2024-03-15 – 2024-03-17 (×3): 100 mg via ORAL
  Filled 2024-03-15 (×3): qty 1

## 2024-03-15 MED ORDER — OXYCODONE HCL 5 MG PO TABS: 5.0000 mg | ORAL_TABLET | ORAL | Status: DC | PRN

## 2024-03-15 MED ORDER — ACETAMINOPHEN 650 MG RE SUPP: 650.0000 mg | Freq: Four times a day (QID) | RECTAL | Status: DC | PRN

## 2024-03-15 NOTE — H&P (Signed)
 " History and Physical    Kent Peterson FMW:[id number] DOB: [birth year] DOA: 03/14/2024  PCP: Vernadine Charlie ORN, MD   Chief Complaint:  lethargy  HPI: Kent Peterson is a 33 y.o. male with medical history significant of chronic encephalopathy who presented to the emergency department with lethargy.  Patient is nonverbal at baseline and his parents are full-time caretakers.  They state he has had recurrent issues with aspiration pneumonia and they can tell when he has infection due to diminished appetite and excessive somnolence.  They found him cold and bradycardic so he was brought to the ER for further assessment.  On arrival he was afebrile and hemodynamically stable.  He was found to be hypothermic.  Labs were obtained on presentation which showed WBC 1.9, hemoglobin 12.9, platelets 113, AST 68, ALT 88.  Lactic acid undetectably low.  Patient underwent CT chest abdomen pelvis which showed small airway infection.  Patient was started on broad-spectrum attics and admitted for further workup.  On admission family was at bedside and patient was at neurologic baseline.   Review of Systems: Review of Systems  All other systems reviewed and are negative.    As per HPI otherwise 10 point review of systems negative.   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 He has never used smokeless tobacco. He reports that he does not drink alcohol and does not use drugs.  Family History  Problem Relation Age of Onset   Diabetes Mother    Hypercholesterolemia Mother    Hypertension Father    Healthy Brother     Prior to Admission medications  Medication Sig Start Date End Date Taking? Authorizing Provider  carbamazepine   (CARBATROL ) 100 MG 12 hr capsule Take 1 capsule (100 mg total) by mouth 2 (two) times daily. 06/30/23  Yes Millikan, Megan, NP  carbamazepine  (CARBATROL ) 200 MG 12 hr capsule Take 2 capsules (400 mg total) by mouth 2 (two) times daily. 06/30/23  Yes Millikan, Megan, NP  feeding supplement (ENSURE PLUS HIGH PROTEIN) LIQD Take 237 mLs by mouth daily. 01/17/24  Yes Cleotilde Lukes, DO  lamoTRIgine  (LAMICTAL ) 150 MG tablet Take 1 tablet (150 mg total) by mouth 2 (two) times daily. 12/31/23  Yes Millikan, Megan, NP  loratadine (CLARITIN) 10 MG tablet Take 10 mg by mouth daily as needed for allergies.   Yes [provider]  magnesium  gluconate (MAGONATE) 500 MG tablet Take 500 mg by mouth daily.   Yes [provider]  mupirocin ointment (BACTROBAN) 2 % Apply 1 Application topically daily.   Yes [provider]  pantoprazole  (PROTONIX ) 40 MG tablet Take 40 mg by mouth daily. 05/30/21  Yes [provider]  polyethylene glycol (MIRALAX ) 17 g packet Take 17 g by mouth daily. Patient taking differently: Take 17 g by mouth daily as needed for mild constipation. 02/23/23  Yes Perri DELENA Meliton Mickey., MD  Prenatal Vit-Fe Fumarate-FA (PRENATAL PO) Take 1 tablet by mouth daily. Unknown brand/dosage   Yes [provider]  risperiDONE  (RISPERDAL ) 2 MG tablet Take 2 mg by mouth 2 (two) times daily.   Yes [provider]  thiamine  (VITAMIN B1) 100 MG tablet Take 1 tablet (100 mg  total) by mouth daily. 01/17/24  Yes Cleotilde Lukes, DO  LORazepam  (ATIVAN ) 1 MG tablet TAKE 1 TABLET BY MOUTH AT BEDTIME AS NEEDED FOR ANXIETY Patient taking differently: Take 1 mg by mouth at bedtime as needed for anxiety. 11/23/18   Jenel Carlin POUR, MD    Physical Exam: Vitals:   03/15/24 0100 03/15/24 0215 03/15/24 0230 03/15/24 0300  BP: 100/71 101/80 123/79 105/76  Pulse: 79 63 63 63  Resp: 15 15 14 20   Temp: (!) 97.4 F (36.3 C) (!) 97.3 F (36.3 C) (!) 97.2 F (36.2 C) (!) 96.9  F (36.1 C)  TempSrc:      SpO2: 100% 100% 100% 100%   Physical Exam Vitals reviewed.  HENT:     Head: Normocephalic.     Nose: Nose normal.     Mouth/Throat:     Mouth: Mucous membranes are moist.     Pharynx: Oropharynx is clear.  Eyes:     Conjunctiva/sclera: Conjunctivae normal.     Pupils: Pupils are equal, round, and reactive to light.  Cardiovascular:     Rate and Rhythm: Normal rate and regular rhythm.     Pulses: Normal pulses.     Heart sounds: Normal heart sounds.  Pulmonary:     Effort: Pulmonary effort is normal.     Breath sounds: Normal breath sounds.  Abdominal:     General: Abdomen is flat. Bowel sounds are normal.     Palpations: Abdomen is soft.  Musculoskeletal:        General: Normal range of motion.  Skin:    General: Skin is warm.     Capillary Refill: Capillary refill takes less than 2 seconds.  Neurological:     Mental Status: He is alert. Mental status is at baseline.  Psychiatric:        Mood and Affect: Mood normal.        Labs on Admission: I have personally reviewed the patients's labs and imaging studies.  Assessment/Plan Principal Problem:   Infectious encephalopathy   # Acute infectious encephalopathy most likely secondary to aspiration pneumonia - Patient was hospitalized on October with similar complaints - No rhonchi - CT scan with infiltrate - Patient more somnolent than usual per family Plan: Placed on Unasyn   # Chronic encephalopathy-patient is on carbamazepine  due to history of seizure as well as Lamictal   # Mood disorder/confusion continue home Ativan , Risperdal   # GERD-continue Protonix     Admission status: Inpatient Telemetry  Certification: The appropriate patient status for this patient is INPATIENT. Inpatient status is judged to be reasonable and necessary in order to provide the required intensity of service to ensure the patient's safety. The patient's presenting symptoms, physical exam findings, and  initial radiographic and laboratory data in the context of their chronic comorbidities is felt to place them at high risk for further clinical deterioration. Furthermore, it is not anticipated that the patient will be medically stable for discharge from the hospital within 2 midnights of admission.   * I certify that at the point of admission it is my clinical judgment that the patient will require inpatient hospital care spanning beyond 2 midnights from the point of admission due to high intensity of service, high risk for further deterioration and high frequency of surveillance required.DEWAINE Lamar Dess MD [facility] If 7PM-7AM, please contact night-coverage [email or web address]  03/15/2024, 4:14 AM        [1]  Allergies Allergen Reactions   Latex Other (See Comments)  No reaction to latex per Mom at bedside   "

## 2024-03-15 NOTE — ED Notes (Signed)
 MD at [facility]

## 2024-03-15 NOTE — Progress Notes (Signed)
 Patient arrived to room. Alert but nonverbal. VSS. Mother is in room with patient. Call bell within reach and bed alarm on.

## 2024-03-15 NOTE — ED Notes (Signed)
 Bair Hugger bumped down to low

## 2024-03-15 NOTE — ED Notes (Signed)
 Bair hugger off when this RN assumed care. Temp foley 98 degrees.

## 2024-03-15 NOTE — Progress Notes (Signed)
 Subjective: Patient admitted this morning, see detailed H&P by Dr Dena  33 y.o. male with medical history significant of chronic encephalopathy who presented to the emergency department with lethargy.  Patient is nonverbal at baseline and his parents are full-time caretakers.  They state he has had recurrent issues with aspiration pneumonia and they can tell when he has infection due to diminished appetite and excessive somnolence.  They found him cold and bradycardic so he was brought to the ER for further assessment.  On arrival he was afebrile and hemodynamically stable.  He was found to be hypothermic.  Labs were obtained on presentation which showed WBC 1.9, hemoglobin 12.9, platelets 113, AST 68, ALT 88.  Lactic acid undetectably low.  Patient underwent CT chest abdomen pelvis which showed small airway infection.  Patient was started on broad-spectrum attics and admitted for further workup   Vitals:   03/15/24 0621 03/15/24 0700  BP:  100/61  Pulse: 66 71  Resp: 17 20  Temp: 97.8 F (36.6 C) 98.5 F (36.9 C)  SpO2: 99% 99%      A/P  # Acute infectious encephalopathy most likely secondary to aspiration pneumonia -Improved after starting Unasyn  - Patient was hospitalized on October with similar complaints - No rhonchi - CT scan with infiltrate - Patient more somnolent than usual per family    # Chronic encephalopathy-patient is on carbamazepine  due to history of seizure as well as Lamictal    # Mood disorder/confusion continue home Ativan , Risperdal    # GERD-continue Protonix    Kent Peterson Brod [facility]

## 2024-03-15 NOTE — ED Notes (Signed)
 Pt brief check,  pt has catheter brief was replaced.

## 2024-03-15 NOTE — ED Notes (Signed)
 Bear hugger reapplied

## 2024-03-15 NOTE — Plan of Care (Signed)

## 2024-03-16 ENCOUNTER — Inpatient Hospital Stay (HOSPITAL_COMMUNITY)

## 2024-03-16 DIAGNOSIS — J189 Pneumonia, unspecified organism: Secondary | ICD-10-CM

## 2024-03-16 DIAGNOSIS — A419 Sepsis, unspecified organism: Secondary | ICD-10-CM

## 2024-03-16 DIAGNOSIS — G9349 Other encephalopathy: Secondary | ICD-10-CM | POA: Diagnosis not present

## 2024-03-16 DIAGNOSIS — R339 Retention of urine, unspecified: Secondary | ICD-10-CM | POA: Diagnosis not present

## 2024-03-16 DIAGNOSIS — B999 Unspecified infectious disease: Secondary | ICD-10-CM | POA: Diagnosis not present

## 2024-03-16 LAB — COMPREHENSIVE METABOLIC PANEL WITH GFR
ALT: 144 U/L — ABNORMAL HIGH (ref 0–44)
AST: 156 U/L — ABNORMAL HIGH (ref 15–41)
Albumin: 3.8 g/dL (ref 3.5–5.0)
Alkaline Phosphatase: 146 U/L — ABNORMAL HIGH (ref 38–126)
Anion gap: 10 (ref 5–15)
BUN: 14 mg/dL (ref 6–20)
CO2: 24 mmol/L (ref 22–32)
Calcium: 9.1 mg/dL (ref 8.9–10.3)
Chloride: 109 mmol/L (ref 98–111)
Creatinine, Ser: 0.72 mg/dL (ref 0.61–1.24)
GFR, Estimated: >60 mL/min
Glucose, Bld: 77 mg/dL (ref 70–99)
Potassium: 4.3 mmol/L (ref 3.5–5.1)
Sodium: 143 mmol/L (ref 135–145)
Total Bilirubin: 0.4 mg/dL (ref 0.0–1.2)
Total Protein: 6.3 g/dL — ABNORMAL LOW (ref 6.5–8.1)

## 2024-03-16 LAB — CBC WITH DIFFERENTIAL/PLATELET
Abs Immature Granulocytes: 0.01 K/uL (ref 0.00–0.07)
Basophils Absolute: 0 K/uL (ref 0.0–0.1)
Basophils Relative: 1 %
Eosinophils Absolute: 0.1 K/uL (ref 0.0–0.5)
Eosinophils Relative: 2 %
HCT: 36.6 % — ABNORMAL LOW (ref 39.0–52.0)
Hemoglobin: 12.5 g/dL — ABNORMAL LOW (ref 13.0–17.0)
Immature Granulocytes: 0 %
Lymphocytes Relative: 26 %
Lymphs Abs: 0.7 K/uL (ref 0.7–4.0)
MCH: 34.2 pg — ABNORMAL HIGH (ref 26.0–34.0)
MCHC: 34.2 g/dL (ref 30.0–36.0)
MCV: 100.3 fL — ABNORMAL HIGH (ref 80.0–100.0)
Monocytes Absolute: 0.3 K/uL (ref 0.1–1.0)
Monocytes Relative: 12 %
Neutro Abs: 1.6 K/uL — ABNORMAL LOW (ref 1.7–7.7)
Neutrophils Relative %: 59 %
Platelets: 106 K/uL — ABNORMAL LOW (ref 150–400)
RBC: 3.65 MIL/uL — ABNORMAL LOW (ref 4.22–5.81)
RDW: 12.3 % (ref 11.5–15.5)
WBC: 2.8 K/uL — ABNORMAL LOW (ref 4.0–10.5)
nRBC: 0 % (ref 0.0–0.2)

## 2024-03-16 LAB — HEPATITIS PANEL, ACUTE
HCV Ab: NONREACTIVE
Hep A IgM: NONREACTIVE
Hep B C IgM: NONREACTIVE
Hepatitis B Surface Ag: NONREACTIVE

## 2024-03-16 NOTE — Progress Notes (Signed)
 [facility]  PROGRESS NOTE  Kent Peterson FMW:[id number] DOB: Apr 29, 1990 DOA: 03/14/2024 PCP: Vernadine Charlie ORN, MD   Brief HPI:   33 y.o. male with medical history significant of chronic encephalopathy who presented to the emergency department with lethargy.  Patient is nonverbal at baseline and his parents are full-time caretakers.  They state he has had recurrent issues with aspiration pneumonia and they can tell when he has infection due to diminished appetite and excessive somnolence.  They found him cold and bradycardic so he was brought to the ER for further assessment.  On arrival he was afebrile and hemodynamically stable.  He was found to be hypothermic.  Labs were obtained on presentation which showed WBC 1.9, hemoglobin 12.9, platelets 113, AST 68, ALT 88.  Lactic acid undetectably low.  Patient underwent CT chest abdomen pelvis which showed small airway infection.  Patient was started on broad-spectrum attics and admitted for further workup       Assessment/Plan:   Transaminitis - Patient has slight elevation of AST ALT and alk phos on admission - Now has worsening LFTs; total bilirubin is normal - Asymptomatic, no nausea or vomiting - Will check acute hepatitis panel, abdominal ultrasound - Follow LFTs in a.m.  Acute ncephalopathy -Improving - Secondary to aspiration pneumonia - Improved with IV Unasyn   Chronic encephalopathy - Patient is nonverbal, currently on carbamazepine  as well as Seroquel for history of seizures  Mood disorder Continue Ativan , Risperdal -  GERD -Continue Protonix       DVT prophylaxis: Lovenox   Medications     carbamazepine   100 mg Oral BID   carbamazepine   400 mg Oral BID   Chlorhexidine  Gluconate Cloth  6 each Topical Daily   enoxaparin  (LOVENOX ) injection  40 mg Subcutaneous Q24H   lamoTRIgine   150 mg Oral BID   magnesium  oxide  400 mg Oral Daily   pantoprazole   40 mg Oral Daily   polyethylene glycol  17 g Oral Daily    risperiDONE   2 mg Oral BID   thiamine   100 mg Oral Daily     Data Reviewed:   CBG:  Recent Labs  Lab 03/14/24 1813  GLUCAP 74    SpO2: 98 %    Vitals:   03/15/24 2058 03/15/24 2350 03/16/24 0423 03/16/24 0757  BP: 102/69 109/74 105/72 98/84  Pulse: 72 61 70 61  Resp: 16 16 17 19   Temp: 98 F (36.7 C) 97.8 F (36.6 C) 97.6 F (36.4 C) 98.5 F (36.9 C)  TempSrc:      SpO2: 97% 98% 98% 98%  Weight:      Height:          Data Reviewed:  Basic Metabolic Panel: Recent Labs  Lab 03/14/24 1949 03/16/24 0812  NA 145 143  K 4.6 4.3  CL 105 109  CO2 31 24  GLUCOSE 72 77  BUN 29* 14  CREATININE 0.75 0.72  CALCIUM 9.8 9.1    CBC: Recent Labs  Lab 03/14/24 1949 03/16/24 0812  WBC 1.9* 2.8*  NEUTROABS 1.2* 1.6*  HGB 12.9* 12.5*  HCT 38.2* 36.6*  MCV 102.1* 100.3*  PLT 113* 106*    LFT Recent Labs  Lab 03/14/24 1949 03/16/24 0812  AST 68* 156*  ALT 88* 144*  ALKPHOS 147* 146*  BILITOT <0.2 0.4  PROT 7.0 6.3*  ALBUMIN 4.4 3.8     Antibiotics: Anti-infectives (From admission, onward)    Start     Dose/Rate Route Frequency Ordered Stop   03/15/24  0430  Ampicillin -Sulbactam (UNASYN ) 3 g in sodium chloride  0.9 % 100 mL IVPB        3 g 200 mL/hr over 30 Minutes Intravenous Every 8 hours 03/15/24 0417     03/14/24 1915  ceFEPIme  (MAXIPIME ) 2 g in sodium chloride  0.9 % 100 mL IVPB        2 g 200 mL/hr over 30 Minutes Intravenous  Once 03/14/24 1907 03/14/24 2029   03/14/24 1915  metroNIDAZOLE  (FLAGYL ) IVPB 500 mg        500 mg 100 mL/hr over 60 Minutes Intravenous  Once 03/14/24 1907 03/14/24 2203   03/14/24 1915  vancomycin  (VANCOCIN ) IVPB 1000 mg/200 mL premix        1,000 mg 200 mL/hr over 60 Minutes Intravenous  Once 03/14/24 1907 03/15/24 0024        CONSULTS   Code Status: Full code  Family Communication: Discussed with patient's brother at bedside     Subjective   No new complaints.  Still slightly lethargic as per  patient's father.   Objective    Physical Examination:   Appears in no acute distress, nonverbal S1-S2, regular, no murmur auscultated Lungs clear to auscultation bilaterally Abdomen is soft, nontender     Wound 03/15/24 1548 Pressure Injury Sacrum Bilateral Stage 2 -  Partial thickness loss of dermis presenting as a shallow open injury with a red, pink wound bed without slough. (Active)        Sabas GORMAN Brod   [facility] If 7PM-7AM, please contact night-coverage at [email or web address], Office  [phone]   03/16/2024, 2:42 PM  LOS: 2 days

## 2024-03-16 NOTE — TOC CM/SW Note (Signed)
 Transition of Care [facility]) - Inpatient Brief Assessment   Patient Details  Name: Kent Peterson MRN: [id number] Date of Birth: [birth year]  Transition of Care [facility]) CM/SW Contact:    Tom-Johnson, Harvest Muskrat, RN Phone Number: 03/16/2024, 11:18 AM   Clinical Narrative:  Patient presented to the ED with Lethargy. CT Chest/Abdomen/Pelvis showed small Airway Infection, on IV abx. Admitted with Infectious Encephalopathy. Patient has hx of Chronic encephalopathy, non-verbal and wheelchair bound baseline, GERD, Seizure.   CM spoke with patient's mom, [name] at bedside about needs for post hospital transition. [name] states patient lives with both his parents, has a caregiver 5 days/week. Family transports to his appointments. Has a wheelchair and shower seat at home. Does not have hx of Home Health disciplines.  PCP is Tisovec, Charlie ORN, MD and uses [facility] on [address]     No ICM needs or recommendations noted at this time.  Patient not Medically ready for discharge.  CM will continue to follow as patient progresses with care towards discharge.     Transition of Care Asessment: Insurance and Status: Insurance coverage has been reviewed Patient has primary care physician: Yes Home environment has been reviewed: Yes Prior level of function:: Wheelchair bound Prior/Current Home Services: Current home services (Personal care services) Social Drivers of Health Review: SDOH reviewed no interventions necessary Readmission risk has been reviewed: Yes Transition of care needs: no transition of care needs at this time

## 2024-03-16 NOTE — Plan of Care (Signed)

## 2024-03-17 ENCOUNTER — Other Ambulatory Visit (HOSPITAL_COMMUNITY): Payer: Self-pay

## 2024-03-17 DIAGNOSIS — G9349 Other encephalopathy: Secondary | ICD-10-CM | POA: Diagnosis not present

## 2024-03-17 DIAGNOSIS — J189 Pneumonia, unspecified organism: Secondary | ICD-10-CM | POA: Diagnosis not present

## 2024-03-17 DIAGNOSIS — R339 Retention of urine, unspecified: Secondary | ICD-10-CM

## 2024-03-17 DIAGNOSIS — A419 Sepsis, unspecified organism: Secondary | ICD-10-CM | POA: Diagnosis not present

## 2024-03-17 LAB — COMPREHENSIVE METABOLIC PANEL WITH GFR
ALT: 128 U/L — ABNORMAL HIGH (ref 0–44)
AST: 111 U/L — ABNORMAL HIGH (ref 15–41)
Albumin: 3.8 g/dL (ref 3.5–5.0)
Alkaline Phosphatase: 146 U/L — ABNORMAL HIGH (ref 38–126)
Anion gap: 8 (ref 5–15)
BUN: 17 mg/dL (ref 6–20)
CO2: 28 mmol/L (ref 22–32)
Calcium: 9.1 mg/dL (ref 8.9–10.3)
Chloride: 106 mmol/L (ref 98–111)
Creatinine, Ser: 0.74 mg/dL (ref 0.61–1.24)
GFR, Estimated: >60 mL/min
Glucose, Bld: 87 mg/dL (ref 70–99)
Potassium: 3.9 mmol/L (ref 3.5–5.1)
Sodium: 142 mmol/L (ref 135–145)
Total Bilirubin: 0.3 mg/dL (ref 0.0–1.2)
Total Protein: 6.2 g/dL — ABNORMAL LOW (ref 6.5–8.1)

## 2024-03-17 LAB — CBC
HCT: 34.2 % — ABNORMAL LOW (ref 39.0–52.0)
Hemoglobin: 11.8 g/dL — ABNORMAL LOW (ref 13.0–17.0)
MCH: 33.7 pg (ref 26.0–34.0)
MCHC: 34.5 g/dL (ref 30.0–36.0)
MCV: 97.7 fL (ref 80.0–100.0)
Platelets: 106 K/uL — ABNORMAL LOW (ref 150–400)
RBC: 3.5 MIL/uL — ABNORMAL LOW (ref 4.22–5.81)
RDW: 12.1 % (ref 11.5–15.5)
WBC: 6.2 K/uL (ref 4.0–10.5)
nRBC: 0 % (ref 0.0–0.2)

## 2024-03-17 MED ORDER — AMOXICILLIN-POT CLAVULANATE 875-125 MG PO TABS
1.0000 | ORAL_TABLET | Freq: Two times a day (BID) | ORAL | 0 refills | Status: AC
  Filled 2024-03-17: qty 10, 5d supply, fill #0

## 2024-03-17 NOTE — Progress Notes (Incomplete)
 [facility]  PROGRESS NOTE  Kent Peterson:[id number] DOB: 10/23/1990 DOA: 03/14/2024 PCP: Vernadine Charlie ORN, MD   Brief HPI:   33 y.o. male with medical history significant of chronic encephalopathy who presented to the emergency department with lethargy.  Patient is nonverbal at baseline and his parents are full-time caretakers.  They state he has had recurrent issues with aspiration pneumonia and they can tell when he has infection due to diminished appetite and excessive somnolence.  They found him cold and bradycardic so he was brought to the ER for further assessment.  On arrival he was afebrile and hemodynamically stable.  He was found to be hypothermic.  Labs were obtained on presentation which showed WBC 1.9, hemoglobin 12.9, platelets 113, AST 68, ALT 88.  Lactic acid undetectably low.  Patient underwent CT chest abdomen pelvis which showed small airway infection.  Patient was started on broad-spectrum attics and admitted for further workup       Assessment/Plan:   Transaminitis - Patient has slight elevation of AST ALT and alk phos on admission - Now has worsening LFTs; total bilirubin is normal - Asymptomatic, no nausea or vomiting - Will check acute hepatitis panel, abdominal ultrasound - Follow LFTs in a.m.  Acute ncephalopathy -Improving - Secondary to aspiration pneumonia - Improved with IV Unasyn   Chronic encephalopathy - Patient is nonverbal, currently on carbamazepine  as well as Lamictal  for history of seizures  Mood disorder Continue Ativan , Risperdal -  GERD -Continue Protonix       DVT prophylaxis: Lovenox   Medications     carbamazepine   100 mg Oral BID   carbamazepine   400 mg Oral BID   Chlorhexidine  Gluconate Cloth  6 each Topical Daily   enoxaparin  (LOVENOX ) injection  40 mg Subcutaneous Q24H   lamoTRIgine   150 mg Oral BID   magnesium  oxide  400 mg Oral Daily   pantoprazole   40 mg Oral Daily   polyethylene glycol  17 g Oral Daily    risperiDONE   2 mg Oral BID   thiamine   100 mg Oral Daily     Data Reviewed:   CBG:  Recent Labs  Lab 03/14/24 1813  GLUCAP 74    SpO2: 97 %    Vitals:   03/16/24 1722 03/16/24 2113 03/17/24 0524 03/17/24 0805  BP: 105/69 107/75 100/69 (!) 94/55  Pulse: 87 84 94 91  Resp: (!) 23 18 18 18   Temp: 98.7 F (37.1 C) 99.2 F (37.3 C) (!) 97.3 F (36.3 C) 100.3 F (37.9 C)  TempSrc: Oral     SpO2: 93% 93% 96% 97%  Weight:      Height:          Data Reviewed:  Basic Metabolic Panel: Recent Labs  Lab 03/14/24 1949 03/16/24 0812 03/17/24 0238  NA 145 143 142  K 4.6 4.3 3.9  CL 105 109 106  CO2 31 24 28   GLUCOSE 72 77 87  BUN 29* 14 17  CREATININE 0.75 0.72 0.74  CALCIUM 9.8 9.1 9.1    CBC: Recent Labs  Lab 03/14/24 1949 03/16/24 0812 03/17/24 0238  WBC 1.9* 2.8* 6.2  NEUTROABS 1.2* 1.6*  --   HGB 12.9* 12.5* 11.8*  HCT 38.2* 36.6* 34.2*  MCV 102.1* 100.3* 97.7  PLT 113* 106* 106*    LFT Recent Labs  Lab 03/14/24 1949 03/16/24 0812 03/17/24 0238  AST 68* 156* 111*  ALT 88* 144* 128*  ALKPHOS 147* 146* 146*  BILITOT <0.2 0.4 0.3  PROT 7.0 6.3*  6.2*  ALBUMIN 4.4 3.8 3.8     Antibiotics: Anti-infectives (From admission, onward)    Start     Dose/Rate Route Frequency Ordered Stop   03/15/24 0430  Ampicillin -Sulbactam (UNASYN ) 3 g in sodium chloride  0.9 % 100 mL IVPB        3 g 200 mL/hr over 30 Minutes Intravenous Every 8 hours 03/15/24 0417     03/14/24 1915  ceFEPIme  (MAXIPIME ) 2 g in sodium chloride  0.9 % 100 mL IVPB        2 g 200 mL/hr over 30 Minutes Intravenous  Once 03/14/24 1907 03/14/24 2029   03/14/24 1915  metroNIDAZOLE  (FLAGYL ) IVPB 500 mg        500 mg 100 mL/hr over 60 Minutes Intravenous  Once 03/14/24 1907 03/14/24 2203   03/14/24 1915  vancomycin  (VANCOCIN ) IVPB 1000 mg/200 mL premix        1,000 mg 200 mL/hr over 60 Minutes Intravenous  Once 03/14/24 1907 03/15/24 0024        CONSULTS   Code Status: Full  code  Family Communication: Discussed with patient's brother at bedside     Subjective   No new complaints.  Still slightly lethargic as per patient's father.   Objective    Physical Examination:        Wound 03/15/24 1548 Pressure Injury Sacrum Bilateral Stage 2 -  Partial thickness loss of dermis presenting as a shallow open injury with a red, pink wound bed without slough. (Active)        Kent Peterson   [facility] If 7PM-7AM, please contact night-coverage at [email or web address], Office  [phone]   03/17/2024, 8:09 AM  LOS: 3 days

## 2024-03-17 NOTE — Discharge Summary (Signed)
 " Physician Discharge Summary   Patient: Kent Peterson MRN: [id number] DOB: 05-Jul-1990  Admit date:     03/14/2024  Discharge date: 03/17/2024  Discharge Physician: Sabas GORMAN Brod   PCP: Tisovec, Richard W, MD   Recommendations at discharge:   Follow-up PCP in 2 weeks Check LFTs as outpatient in 1 week  Discharge Diagnoses: Principal Problem:   Infectious encephalopathy  Resolved Problems:   * No resolved hospital problems. *  Hospital Course: 33 y.o. male with medical history significant of chronic encephalopathy who presented to the emergency department with lethargy.  Patient is nonverbal at baseline and his parents are full-time caretakers.  They state he has had recurrent issues with aspiration pneumonia and they can tell when he has infection due to diminished appetite and excessive somnolence.  They found him cold and bradycardic so he was brought to the ER for further assessment.  On arrival he was afebrile and hemodynamically stable.  He was found to be hypothermic.  Labs were obtained on presentation which showed WBC 1.9, hemoglobin 12.9, platelets 113, AST 68, ALT 88.  Lactic acid undetectably low.  Patient underwent CT chest abdomen pelvis which showed small airway infection.  Patient was started on broad-spectrum attics and admitted for further workup     Assessment and Plan:  Transaminitis -Improving - Patient had slight elevation of AST ALT and alk phos on admission - worsening LFTs; total bilirubin is normal - Asymptomatic, no nausea or vomiting - Hepatitis panel was checked, was negative -Abdominal ultrasound shows hepatic steatosis, no gallbladder disease -Follow-up PCP in 1 week  Acute ncephalopathy -Improving - Secondary to aspiration pneumonia - Improved with IV Unasyn  - Will discharge on Augmentin  as below  Aspiration pneumonia - Improved with IV Unasyn  - Will discharge on Augmentin  1 tablet p.o. twice daily for 5 more days   Chronic  encephalopathy - Patient is nonverbal, currently on carbamazepine  as well as Seroquel for history of seizures   Mood disorder Continue Ativan , Risperdal    GERD -Continue Protonix           Consultants:  Procedures performed:  Disposition: Home Diet recommendation:  Regular diet DISCHARGE MEDICATION: Allergies as of 03/17/2024       Reactions   Latex Other (See Comments)   No reaction to latex per Mom at bedside        Medication List     TAKE these medications    amoxicillin -clavulanate 875-125 MG tablet Commonly known as: AUGMENTIN  Take 1 tablet by mouth 2 (two) times daily for 5 days.   carbamazepine  100 MG 12 hr capsule Commonly known as: CARBATROL  Take 1 capsule (100 mg total) by mouth 2 (two) times daily.   carbamazepine  200 MG 12 hr capsule Commonly known as: CARBATROL  Take 2 capsules (400 mg total) by mouth 2 (two) times daily.   feeding supplement Liqd Take 237 mLs by mouth daily.   lamoTRIgine  150 MG tablet Commonly known as: LAMICTAL  Take 1 tablet (150 mg total) by mouth 2 (two) times daily.   loratadine 10 MG tablet Commonly known as: CLARITIN Take 10 mg by mouth daily as needed for allergies.   LORazepam  1 MG tablet Commonly known as: ATIVAN  TAKE 1 TABLET BY MOUTH AT BEDTIME AS NEEDED FOR ANXIETY What changed: See the new instructions.   magnesium  gluconate 500 MG tablet Commonly known as: MAGONATE Take 500 mg by mouth daily.   mupirocin ointment 2 % Commonly known as: BACTROBAN Apply 1 Application topically daily.   pantoprazole   40 MG tablet Commonly known as: PROTONIX  Take 40 mg by mouth daily.   polyethylene glycol 17 g packet Commonly known as: MiraLax  Take 17 g by mouth daily. What changed:  when to take this reasons to take this   PRENATAL PO Take 1 tablet by mouth daily. Unknown brand/dosage   risperiDONE  2 MG tablet Commonly known as: RISPERDAL  Take 2 mg by mouth 2 (two) times daily.   thiamine  100 MG  tablet Commonly known as: VITAMIN B1 Take 1 tablet (100 mg total) by mouth daily.        Discharge Exam: Filed Weights   03/15/24 0400  Weight: 47.6 kg   General-appears in no acute distress Heart-S1-S2, regular, no murmur auscultated Lungs-clear to auscultation bilaterally, no wheezing or crackles auscultated  Neuro-alert, nonverbal, does not follow commands   Condition at discharge: good  The results of significant diagnostics from this hospitalization (including imaging, microbiology, ancillary and laboratory) are listed below for reference.   Imaging Studies: US  Abdomen Limited RUQ (LIVER/GB) Result Date: 03/16/2024 CLINICAL DATA:  Transaminitis EXAM: ULTRASOUND ABDOMEN LIMITED RIGHT UPPER QUADRANT COMPARISON:  CT 03/14/2024 FINDINGS: Gallbladder: Slightly limited assessment due to patient physical condition. No obvious shadowing stones. Normal wall thickness. Negative sonographic Murphy. Common bile duct: Diameter: 2.3 mm Liver: Liver slightly echogenic. No focal hepatic abnormality. Portal vein is patent on color Doppler imaging with normal direction of blood flow towards the liver. Other: None. IMPRESSION: 1. Slightly limited assessment of the gallbladder. No obvious shadowing stones. 2. Slightly echogenic liver parenchyma suggesting hepatic steatosis. Electronically Signed   By: Luke Bun M.D.   On: 03/16/2024 22:56   CT CHEST ABDOMEN PELVIS W CONTRAST Result Date: 03/14/2024 EXAM: CT CHEST, ABDOMEN AND PELVIS WITH CONTRAST 03/14/2024 09:43:04 PM TECHNIQUE: CT of the chest, abdomen and pelvis was performed with the administration of 60 mL of iohexol  (OMNIPAQUE ) 350 MG/ML injection. Multiplanar reformatted images are provided for review. Automated exposure control, iterative reconstruction, and/or weight based adjustment of the mA/kV was utilized to reduce the radiation dose to as low as reasonably achievable. COMPARISON: None available. CLINICAL HISTORY: Undifferentiated  sepsis, history of aspiration PNA, hypothermic. FINDINGS: CHEST: MEDIASTINUM AND LYMPH NODES: Heart and pericardium are unremarkable. The central airways are clear. No mediastinal, hilar or axillary lymphadenopathy. LUNGS AND PLEURA: There are a few clustered centrilobular micronodules in the medial right lower lobe compatible with mild small airway infection / inflammation. The lungs are otherwise clear. No pleural effusion or pneumothorax. ABDOMEN AND PELVIS: LIVER: The liver is unremarkable. GALLBLADDER AND BILE DUCTS: Gallbladder is unremarkable. No biliary ductal dilatation. SPLEEN: No acute abnormality. PANCREAS: No acute abnormality. ADRENAL GLANDS: No acute abnormality. KIDNEYS, URETERS AND BLADDER: No stones in the kidneys or ureters. No hydronephrosis. No perinephric or periureteral stranding. Distended bladder. Redemonstrated hyperdense layering debris in the bladder measuring up to 7 mm. GI AND BOWEL: Stomach demonstrates no acute abnormality. There is no bowel obstruction. Moderate colonic stool burden compatible with constipation. REPRODUCTIVE ORGANS: No acute abnormality. PERITONEUM AND RETROPERITONEUM: No ascites. No free air. VASCULATURE: Aorta is normal in caliber. ABDOMINAL AND PELVIS LYMPH NODES: No lymphadenopathy. REPRODUCTIVE ORGANS: No acute abnormality. BONES AND SOFT TISSUES: Postoperative changes on femur. No acute osseous abnormality. No focal soft tissue abnormality. IMPRESSION: 1. Mild small airway infection/inflammation in the medial right lower lobe. 2. Constipation. 3. Distended bladder with layering debris. Electronically signed by: Norman Gatlin MD 03/14/2024 10:03 PM EST RP Workstation: [id number]   DG Chest Portable 1 View Result Date: 03/14/2024  CLINICAL DATA:  Lethargy. EXAM: PORTABLE CHEST 1 VIEW COMPARISON:  01/30/2024 FINDINGS: The cardiomediastinal contours are normal. The lungs are clear. Pulmonary vasculature is normal. No consolidation, pleural effusion, or  pneumothorax. No acute osseous abnormalities are seen. IMPRESSION: No active disease. Electronically Signed   By: Andrea Gasman M.D.   On: 03/14/2024 19:26    Microbiology: Results for orders placed or performed during the hospital encounter of 03/14/24  Blood culture (routine x 2)     Status: None (Preliminary result)   Collection Time: 03/14/24  7:47 PM   Specimen: BLOOD RIGHT HAND  Result Value Ref Range Status   Specimen Description BLOOD RIGHT HAND  Final   Special Requests   Final    BOTTLES DRAWN AEROBIC AND ANAEROBIC Blood Culture results may not be optimal due to an inadequate volume of blood received in culture bottles   Culture   Final    NO GROWTH 3 DAYS Performed at [facility], [address] [place], [name] [address]    Report Status PENDING  Incomplete  Blood culture (routine x 2)     Status: None (Preliminary result)   Collection Time: 03/14/24  7:47 PM   Specimen: BLOOD RIGHT ARM  Result Value Ref Range Status   Specimen Description BLOOD RIGHT ARM  Final   Special Requests   Final    BOTTLES DRAWN AEROBIC AND ANAEROBIC Blood Culture adequate volume   Culture   Final    NO GROWTH 3 DAYS Performed at [facility], [address], [place], [name] [address]    Report Status PENDING  Incomplete  Resp panel by RT-PCR (RSV, Flu A&B, Covid) Anterior Nasal Swab     Status: None   Collection Time: 03/14/24  7:49 PM   Specimen: Anterior Nasal Swab  Result Value Ref Range Status   SARS Coronavirus 2 by RT PCR NEGATIVE NEGATIVE Final   Influenza A by PCR NEGATIVE NEGATIVE Final   Influenza B by PCR NEGATIVE NEGATIVE Final    Comment: (NOTE) The Xpert Xpress SARS-CoV-2/FLU/RSV plus assay is intended as an aid in the diagnosis of influenza from Nasopharyngeal swab specimens and should not be used as a sole basis for treatment. Nasal washings and aspirates are unacceptable for Xpert Xpress SARS-CoV-2/FLU/RSV testing.  Fact Sheet for  Patients: bloggercourse.com  Fact Sheet for Healthcare Providers: seriousbroker.it  This test is not yet approved or cleared by the [place]  FDA and has been authorized for detection and/or diagnosis of SARS-CoV-2 by FDA under an Emergency Use Authorization (EUA). This EUA will remain in effect (meaning this test can be used) for the duration of the COVID-19 declaration under Section 564(b)(1) of the Act, 21 U.S.C. section 360bbb-3(b)(1), unless the authorization is terminated or revoked.     Resp Syncytial Virus by PCR NEGATIVE NEGATIVE Final    Comment: (NOTE) Fact Sheet for Patients: bloggercourse.com  Fact Sheet for Healthcare Providers: seriousbroker.it  This test is not yet approved or cleared by the [place]  FDA and has been authorized for detection and/or diagnosis of SARS-CoV-2 by FDA under an Emergency Use Authorization (EUA). This EUA will remain in effect (meaning this test can be used) for the duration of the COVID-19 declaration under Section 564(b)(1) of the Act, 21 U.S.C. section 360bbb-3(b)(1), unless the authorization is terminated or revoked.  Performed at [facility], [address], [place], [name] [address]     Labs: CBC: Recent Labs  Lab 03/14/24 1949 03/16/24 [id number]  03/17/24 0238  WBC 1.9* 2.8* 6.2  NEUTROABS 1.2* 1.6*  --   HGB 12.9* 12.5* 11.8*  HCT 38.2* 36.6* 34.2*  MCV 102.1* 100.3* 97.7  PLT 113* 106* 106*   Basic Metabolic Panel: Recent Labs  Lab 03/14/24 1949 03/16/24 0812 03/17/24 0238  NA 145 143 142  K 4.6 4.3 3.9  CL 105 109 106  CO2 31 24 28   GLUCOSE 72 77 87  BUN 29* 14 17  CREATININE 0.75 0.72 0.74  CALCIUM 9.8 9.1 9.1   Liver Function Tests: Recent Labs  Lab 03/14/24 1949 03/16/24 0812 03/17/24 0238  AST 68* 156* 111*  ALT 88* 144* 128*  ALKPHOS 147* 146* 146*  BILITOT <0.2 0.4 0.3  PROT 7.0  6.3* 6.2*  ALBUMIN 4.4 3.8 3.8   CBG: Recent Labs  Lab 03/14/24 1813  GLUCAP 74    Discharge time spent: greater than 30 minutes.  Signed: Sabas GORMAN Brod, MD [facility] 03/17/2024 "

## 2024-03-17 NOTE — Progress Notes (Signed)
 D/c tele and IV. Went over AVS with pt's mother, sharon Reid and all questions were addressed.   Amado GORMAN Arabia, RN\

## 2024-03-17 NOTE — TOC Transition Note (Signed)
 Transition of Care [facility]) - Discharge Note   Patient Details  Name: BRAIN HONEYCUTT MRN: [id number] Date of Birth: Dec 09, 1990  Transition of Care [facility]) CM/SW Contact:  Roxie KANDICE Stain, RN Phone Number: 03/17/2024, 2:47 PM   Clinical Narrative:     [name] is stable to discharge home. Follow up apt on AVS. No ICM (Inpatient Care Management) needs at this time.   Final next level of care: Home/Self Care Barriers to Discharge: Barriers Resolved   Patient Goals and CMS Choice         home   Discharge Placement             home          Discharge Plan and Services Additional resources added to the After Visit Summary for                                       Social Drivers of Health (SDOH) Interventions SDOH Screenings   Food Insecurity: No Food Insecurity (03/15/2024)  Housing: Low Risk (03/15/2024)  Transportation Needs: No Transportation Needs (03/15/2024)  Utilities: Not At Risk (03/15/2024)  Tobacco Use: Low Risk (03/14/2024)     Readmission Risk Interventions    03/16/2024   11:17 AM  Readmission Risk Prevention Plan  Transportation Screening Complete  PCP or Specialist Appt within 5-7 Days Complete  Home Care Screening Complete  Medication Review (RN CM) Referral to Pharmacy

## 2024-03-17 NOTE — Plan of Care (Signed)

## 2024-03-19 LAB — CULTURE, BLOOD (ROUTINE X 2): Culture: NO GROWTH

## 2024-03-19 NOTE — Progress Notes (Addendum)
 Got a call from lab that patient's blood culture was positive 1 out of 4 bottles for gram-positive rods.  Called and discussed with ID, seems contamination with bacillus species.  Will follow the result.  No need to call back at this time.  Date signed 03/19/2024 at 8 AM

## 2024-03-22 ENCOUNTER — Ambulatory Visit: Admitting: Neurology

## 2024-03-22 ENCOUNTER — Encounter: Payer: Self-pay | Admitting: Neurology

## 2024-03-22 VITALS — BP 102/71 | HR 57 | Temp 97.3°F | Ht 65.0 in

## 2024-03-22 DIAGNOSIS — G40909 Epilepsy, unspecified, not intractable, without status epilepticus: Secondary | ICD-10-CM

## 2024-03-22 DIAGNOSIS — R251 Tremor, unspecified: Secondary | ICD-10-CM | POA: Diagnosis not present

## 2024-03-22 LAB — CULTURE, BLOOD (ROUTINE X 2): Special Requests: ADEQUATE

## 2024-03-22 MED ORDER — LAMOTRIGINE 150 MG PO TABS: 150.0000 mg | ORAL_TABLET | Freq: Two times a day (BID) | ORAL | 4 refills | Status: AC

## 2024-03-22 NOTE — Progress Notes (Signed)
 "   PATIENT: Kent Peterson DOB: 19-Jun-1990  REASON FOR VISIT: follow up HISTORY FROM: Mother  Chief Complaint  Patient presents with   Follow-up    Room 13, with mom,  seizure f/u, pt now has shake in left hand, right hand weaker last sz in 10/25     HISTORY OF PRESENT ILLNESS: Today 03/22/2024: Kent Peterson is a 33 y.o. male with a history of seizures who is presenting for follow up. He is accompanied by his mother, last visit was in April. Since then, he has been in and out of the hospital due to pneumonia and infection. During his hospitalization in October he did have a seizures most likely due to rapid temperature change.  Mother told me that he was taken to the hospital for HIFU therapy, temperature around 91 and after they put on the warming blanket, it rise up to more than 100 causing him to have a generalized seizure.  He was loaded with Keppra .  Upon discharge his antiseizure medications were continued, carbamazepine  500 mg twice daily and lamotrigine  150 mg twice daily.  She has not had another seizure.  Mother main complaint today is left hand tremor.  She reported the tremor is easily support suppressible by touching the patient's hand.  INTERVAL HISTORY MM 06/30/2023 Returns today for follow-up.  At the last visit he saw Dr. Gregg.  No changes were made to his medication.  Blood work was checked and carbamazepine  was elevated consistent with high dose of carbamazepine .  Since he was last seen he was hospitalized in November for sepsis.  It was noted that his temperature was 91.4 on admission to the ED. he is here today with his dad.  He notes that he has been slightly different the last couple days.  States that he feels a little cool today.  Reports eating and drinking well.  Reports that he has not had any seizure events that he is aware of.  Remains on carbamazepine  500 mg twice a day and Lamictal  150 mg twice a day   06/24/22: (Copied from Dr. Janean  note):  Patient presents today for follow-up, he is accompanied by both parents.  Last visit was a year ago.  Since then he has not had any seizure or seizure like activity.  He is compliant with his lamotrigine  and carbamazepine .  They did follow-up with the oncologist, primary care doctor, and GI doctors, was found that he has iron deficiency anemia due to GI bleed, patient was put on PPIs and prenatal vitamins, parents do report that his lab values continue to improve.  He is doing much better.  They do not have any current complaints at the moment.  06/19/21: Kent Peterson is a 33 year old male with a history of viral encephalitis with associated seizures.   He returns today for follow-up with his mother.  She reports no seizures. Mother reports that his Hgb has been low, protein low- reports that he will be seeing a gastroenterologist tomorrow.  05/30/20: Kent Peterson is a 33 year old male with a history of viral encephalitis with associated seizures.  He returns today for follow-up.  He is here with his mother.  He remains on carbamazepine  and Lamictal  150 mg twice a day.  Denies any seizure events.  06/14/19: Kent Peterson is a 33 year old male with a history of viral encephalitis with associated seizures.  He returns today with his mother.  He remains on carbamazepine  and Lamictal .  Mother denies any seizure events.  Reports  that he tolerates the medication well.  Denies any new issues.  Returns today for follow-up.  HISTORY 03/23/18:   Kent Peterson is a 33 year old male with a history of viral encephalitis with associated seizures.  He returns today for follow-up.  He is currently on carbamazepine  and Lamictal .  His mother reports that he has not had any seizure events.  He continues to tolerate the medication well.  The patient primarily uses a wheelchair when ambulating.  His mother states that at home he can stand with assistance.  She reports that he typically does not follow commands and is  visually impaired.  He returns today for evaluation.  REVIEW OF SYSTEMS: Out of a complete 14 system review of symptoms, the patient complains only of the following symptoms, and all other reviewed systems are negative.  See HPI  ALLERGIES: No Known Allergies   HOME MEDICATIONS: Outpatient Medications Prior to Visit  Medication Sig Dispense Refill   amoxicillin -clavulanate (AUGMENTIN ) 875-125 MG tablet Take 1 tablet by mouth 2 (two) times daily for 5 days. 10 tablet 0   carbamazepine  (CARBATROL ) 100 MG 12 hr capsule Take 1 capsule (100 mg total) by mouth 2 (two) times daily. 180 capsule 3   carbamazepine  (CARBATROL ) 200 MG 12 hr capsule Take 2 capsules (400 mg total) by mouth 2 (two) times daily. 360 capsule 3   feeding supplement (ENSURE PLUS HIGH PROTEIN) LIQD Take 237 mLs by mouth daily.     loratadine (CLARITIN) 10 MG tablet Take 10 mg by mouth daily as needed for allergies.     LORazepam  (ATIVAN ) 1 MG tablet TAKE 1 TABLET BY MOUTH AT BEDTIME AS NEEDED FOR ANXIETY (Patient taking differently: Take 1 mg by mouth at bedtime as needed for anxiety.) 30 tablet 1   magnesium  gluconate (MAGONATE) 500 MG tablet Take 500 mg by mouth daily.     mupirocin ointment (BACTROBAN) 2 % Apply 1 Application topically daily.     polyethylene glycol (MIRALAX ) 17 g packet Take 17 g by mouth daily. 14 each 0   Prenatal Vit-Fe Fumarate-FA (PRENATAL PO) Take 1 tablet by mouth daily. Unknown brand/dosage     risperiDONE  (RISPERDAL ) 2 MG tablet Take 2 mg by mouth 2 (two) times daily.     thiamine  (VITAMIN B1) 100 MG tablet Take 1 tablet (100 mg total) by mouth daily. 30 tablet 0   lamoTRIgine  (LAMICTAL ) 150 MG tablet Take 1 tablet (150 mg total) by mouth 2 (two) times daily. 180 tablet 0   pantoprazole  (PROTONIX ) 40 MG tablet Take 40 mg by mouth daily. (Patient not taking: Reported on 03/22/2024)     No facility-administered medications prior to visit.    PAST MEDICAL HISTORY: Past Medical History:   Diagnosis Date   Chronic static encephalopathy 03/12/2017   Following viral encephalitis as an infant   Seizure disorder (HCC) 03/12/2017   Seizures (HCC)     PAST SURGICAL HISTORY: Past Surgical History:  Procedure Laterality Date   right leg surgery      FAMILY HISTORY: Family History  Problem Relation Age of Onset   Diabetes Mother    Hypercholesterolemia Mother    Hypertension Father    Healthy Brother     SOCIAL HISTORY: Social History   Socioeconomic History   Marital status: Single    Spouse name: Not on file   Number of children: 0   Years of education: 12   Highest education level: Not on file  Occupational History   Not on file  Tobacco Use   Smoking status: Never   Smokeless tobacco: Never  Vaping Use   Vaping status: Never Used  Substance and Sexual Activity   Alcohol use: No   Drug use: No   Sexual activity: Not on file  Other Topics Concern   Not on file  Social History Narrative   Lives with parents   Caffeine use:    none   Social Drivers of Health   Tobacco Use: Low Risk (03/22/2024)   Patient History    Smoking Tobacco Use: Never    Smokeless Tobacco Use: Never    Passive Exposure: Not on file  Financial Resource Strain: Not on file  Food Insecurity: No Food Insecurity (03/15/2024)   Epic    Worried About Programme Researcher, Broadcasting/film/video in the Last Year: Never true    Ran Out of Food in the Last Year: Never true  Transportation Needs: No Transportation Needs (03/15/2024)   Epic    Lack of Transportation (Medical): No    Lack of Transportation (Non-Medical): No  Physical Activity: Not on file  Stress: Not on file  Social Connections: Not on file  Intimate Partner Violence: Patient Unable To Answer (03/15/2024)   Epic    Fear of Current or Ex-Partner: Patient unable to answer    Emotionally Abused: Patient unable to answer    Physically Abused: Patient unable to answer    Sexually Abused: Patient unable to answer  Depression (PHQ2-9):  Not on file  Alcohol Screen: Not on file  Housing: Low Risk (03/15/2024)   Epic    Unable to Pay for Housing in the Last Year: No    Number of Times Moved in the Last Year: 0    Homeless in the Last Year: No  Utilities: Not At Risk (03/15/2024)   Epic    Threatened with loss of utilities: No  Health Literacy: Not on file      PHYSICAL EXAM  Vitals:   03/22/24 1023  BP: 102/71  Pulse: (!) 57  Temp: (!) 97.3 F (36.3 C)  SpO2: 98%  Height: 5' 5 (1.651 m)    Body mass index is 17.47 kg/m.  Generalized: Well developed, in no acute distress   Neurological examination  Mentation: Alert.  Does not follow commands.  Nonverbal Cranial nerve II-XII: unable to test Motor: Picks up items and puts it to his mouth. Sensory: Unable to test Coordination: Unable to test Gait and station: In a motorized wheelchair   DIAGNOSTIC DATA (LABS, IMAGING, TESTING) - I reviewed patient records, labs, notes, testing and imaging myself where available.  Lab Results  Component Value Date   WBC 6.2 03/17/2024   HGB 11.8 (L) 03/17/2024   HCT 34.2 (L) 03/17/2024   MCV 97.7 03/17/2024   PLT 106 (L) 03/17/2024      Component Value Date/Time   NA 142 03/17/2024 0238   NA 145 (H) 06/24/2022 1004   K 3.9 03/17/2024 0238   CL 106 03/17/2024 0238   CO2 28 03/17/2024 0238   GLUCOSE 87 03/17/2024 0238   BUN 17 03/17/2024 0238   BUN 20 06/24/2022 1004   CREATININE 0.74 03/17/2024 0238   CALCIUM 9.1 03/17/2024 0238   PROT 6.2 (L) 03/17/2024 0238   PROT 5.2 (L) 06/19/2020 0925   ALBUMIN 3.8 03/17/2024 0238   ALBUMIN 3.6 (L) 06/19/2020 0925   AST 111 (H) 03/17/2024 0238   ALT 128 (H) 03/17/2024 0238   ALKPHOS 146 (H) 03/17/2024 0238   BILITOT 0.3  03/17/2024 0238   BILITOT <0.2 06/19/2020 0925   GFRNONAA >60 03/17/2024 0238   GFRAA 135 06/14/2019 0927      ASSESSMENT AND PLAN 33 y.o. year old male  has a past medical history of Chronic static encephalopathy (03/12/2017), Seizure  disorder (HCC) (03/12/2017), and Seizures (HCC). here with:  1.  Seizure disorder 2.  Tremors  -Continue carbamazepine  500 mg BID: Currently taking Carbatrol  200 mg 2 tablets twice a day in addition to Carbatrol  100 mg tablets twice a day -Continue Lamictal  150 mg twice a day  -Advised mother to videotape the left arm tremors.  Tremors are likely from medication side effect -Follow-up in 6 months with Megan Millikan, NP    Pastor Falling, MD 03/22/2024, 10:56 AM [facility] [address] [place], [name] [address] [phone]    "

## 2024-03-22 NOTE — Patient Instructions (Addendum)
 Continue with current antiseizure medications No carbamazepine  500 mg twice daily Doing well yeah it was good does not need lamotrigine  100 mg daily Please videotape left hand tremor Follow-up in 6 months or sooner if worse

## 2024-04-27 ENCOUNTER — Other Ambulatory Visit (HOSPITAL_COMMUNITY): Payer: Self-pay | Admitting: *Deleted

## 2024-04-27 ENCOUNTER — Other Ambulatory Visit (HOSPITAL_COMMUNITY): Payer: Self-pay | Admitting: Internal Medicine

## 2024-04-27 DIAGNOSIS — R1310 Dysphagia, unspecified: Secondary | ICD-10-CM

## 2024-05-24 ENCOUNTER — Encounter (HOSPITAL_COMMUNITY)

## 2024-05-24 ENCOUNTER — Ambulatory Visit (HOSPITAL_COMMUNITY)

## 2024-09-20 ENCOUNTER — Ambulatory Visit: Admitting: Adult Health

## 2024-10-19 ENCOUNTER — Ambulatory Visit: Admitting: Neurology
